# Patient Record
Sex: Female | Born: 1985 | Race: Black or African American | Hispanic: No | Marital: Single | Smoking: Former smoker
Health system: Southern US, Community
[De-identification: ages and names within clinical notes are randomized; demographics above are authoritative.]

## PROBLEM LIST (undated history)

## (undated) DIAGNOSIS — D573 Sickle-cell trait: Secondary | ICD-10-CM

## (undated) HISTORY — DX: Sickle-cell trait: D57.3

---

## 2005-03-10 ENCOUNTER — Emergency Department: Payer: Self-pay | Admitting: General Practice

## 2006-10-09 ENCOUNTER — Emergency Department: Payer: Self-pay | Admitting: Emergency Medicine

## 2006-10-11 ENCOUNTER — Ambulatory Visit: Payer: Self-pay | Admitting: Obstetrics and Gynecology

## 2006-10-15 ENCOUNTER — Ambulatory Visit: Payer: Self-pay | Admitting: Obstetrics and Gynecology

## 2006-11-12 ENCOUNTER — Ambulatory Visit: Payer: Self-pay | Admitting: Obstetrics and Gynecology

## 2006-12-03 ENCOUNTER — Ambulatory Visit: Payer: Self-pay | Admitting: Advanced Practice Midwife

## 2007-05-20 ENCOUNTER — Inpatient Hospital Stay: Payer: Self-pay

## 2008-04-02 IMAGING — US US OB < 14 WEEKS - US OB TV
1 series · 17 of 28 positions shown · non-contrast
Comparison: none

REASON FOR EXAM: Abd Pain    Pregnant
COMMENTS:

[Series 1: us ob < 14 weeks - us ob tv · 17 of 35 slices shown]
[im 1/35]
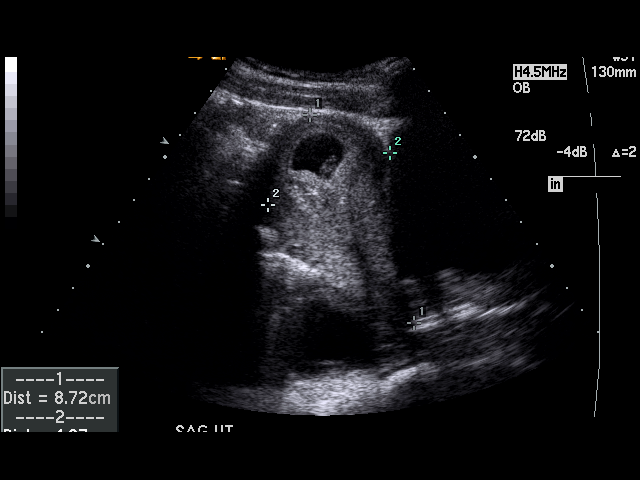
[im 3/35]
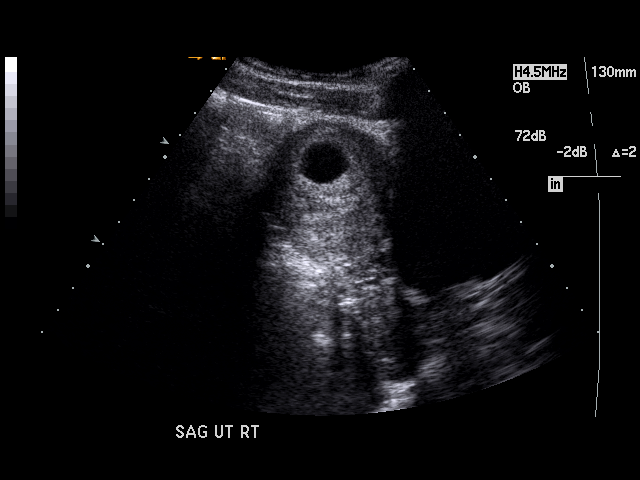
[im 6/35]
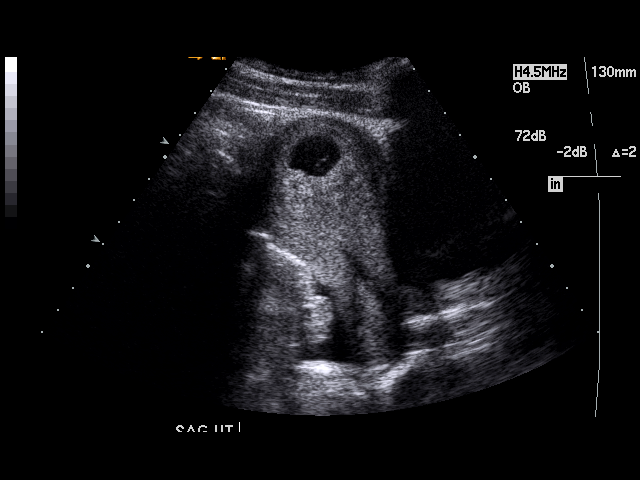
[im 7/35]
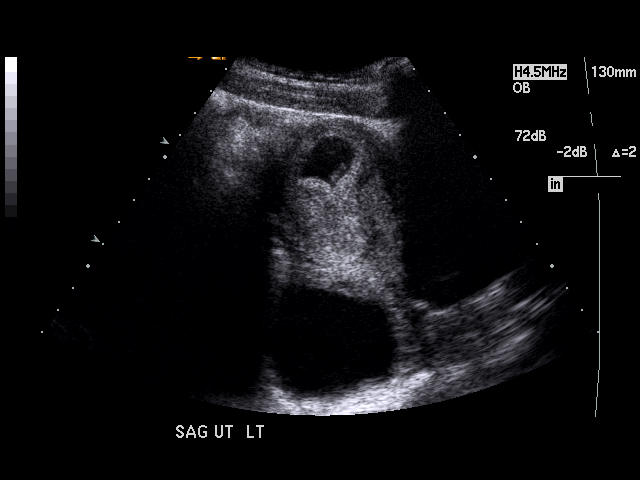
[im 9/35]
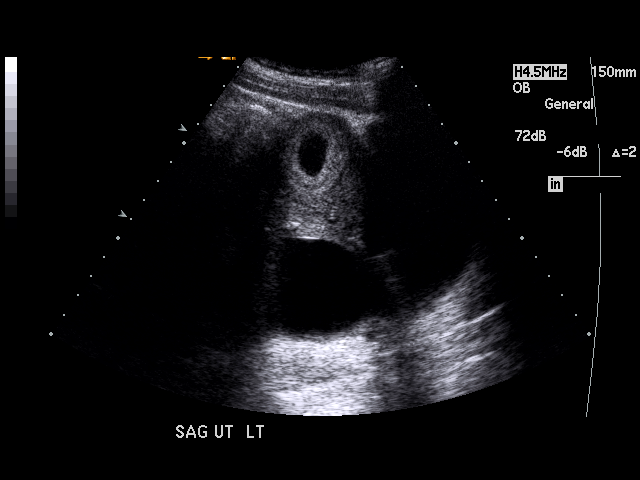
[im 12/35]
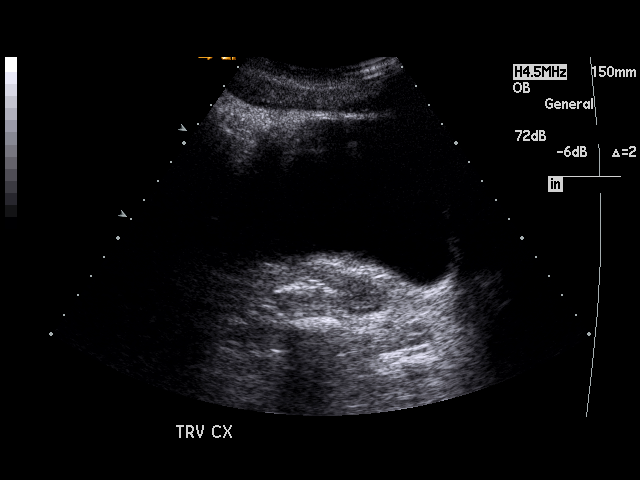
[im 13/35]
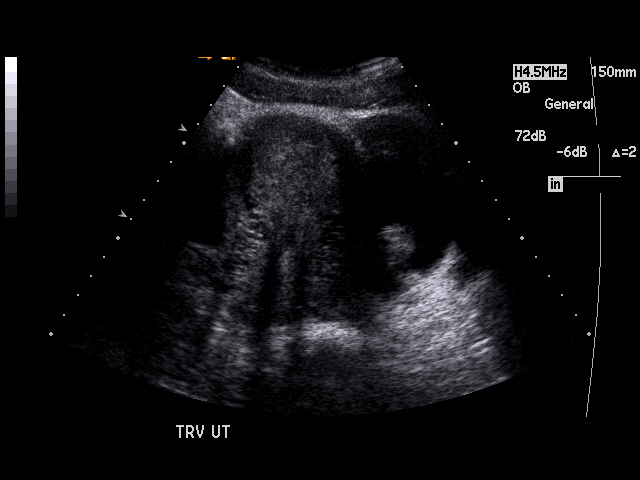
[im 16/35]
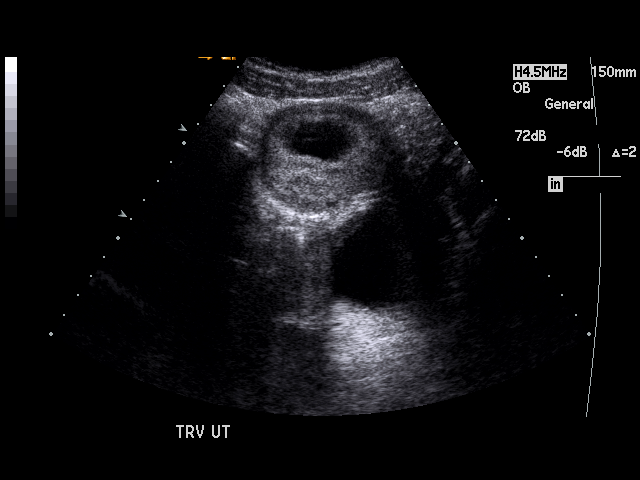
[im 18/35]
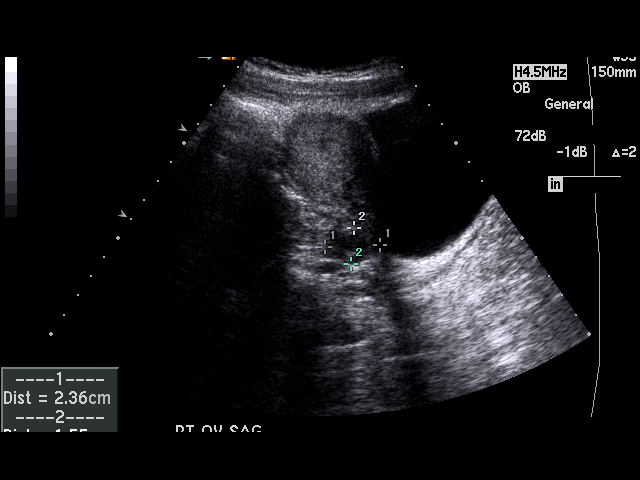
[im 19/35]
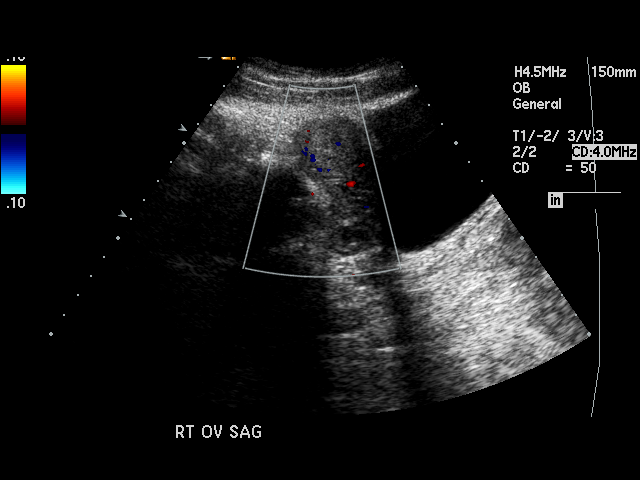
[im 22/35]
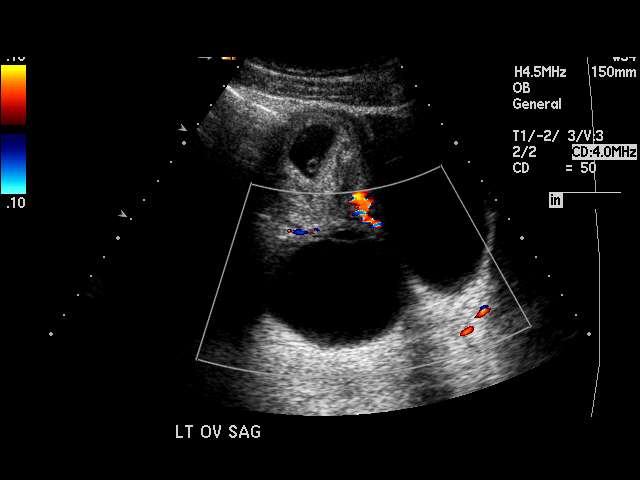
[im 23/35]
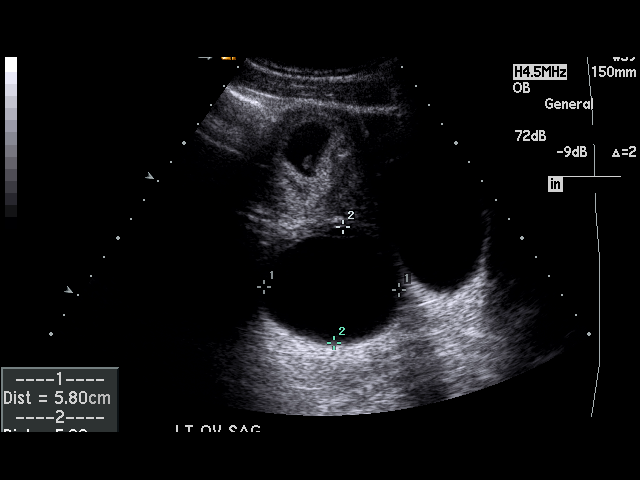
[im 26/35]
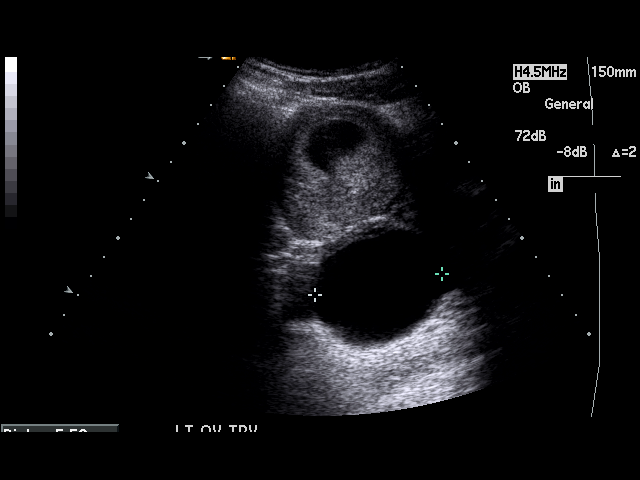
[im 28/35]
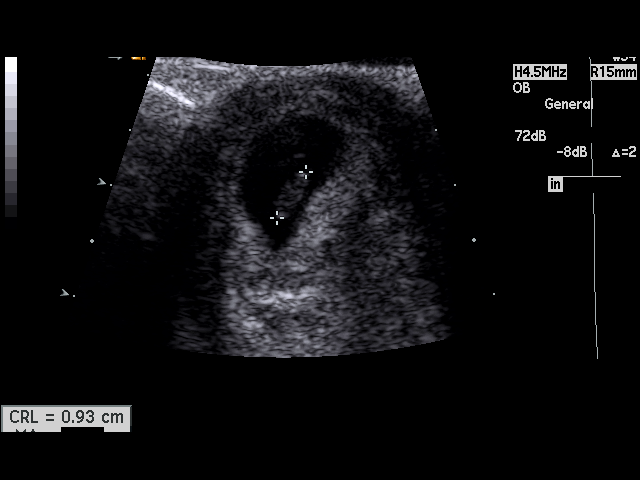
[im 29/35]
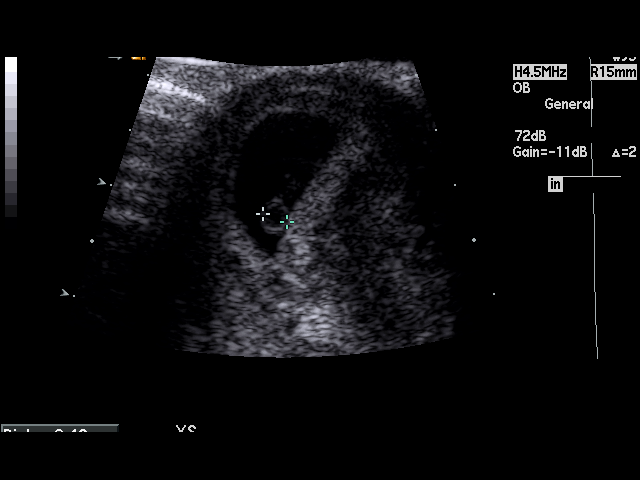
[im 32/35]
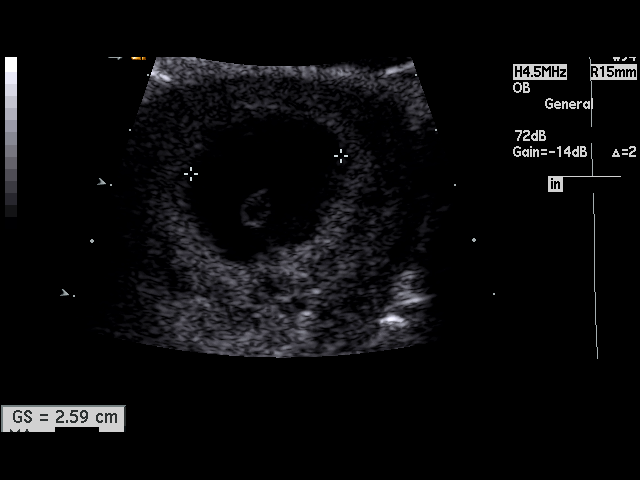
[im 35/35]
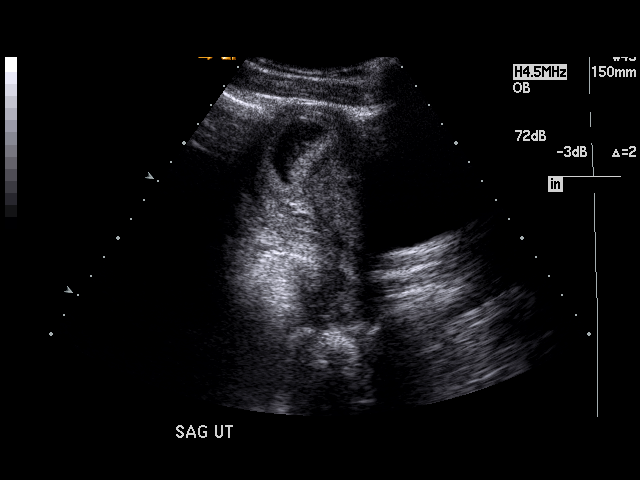

[17 of 28 positions shown; findings below may reference images not displayed]

PROCEDURE:     US  - US OB LESS THAN 14 WEEKS  - October 15, 2006  [DATE]

RESULT:     There is a living intrauterine embryo. Embryo heart rate was
monitored at 147 beats per minute. Crown-rump length measures 0.93 cm which
corresponds to an estimated gestational age 7 weeks 0 days. Gestational sac
size measures 2.59 cm which corresponds to an estimated gestational age of 7
weeks 2 days. The RIGHT ovary is normal appearance. There is a 5.8 cm cyst
of the LEFT every. No free fluid seen in the pelvis.
IMPRESSION: 1. Living intrauterine gestation of approximately 7 weeks 1 day  gestational
age.
2. Ultrasound EDD is June 02, 2007.
3. There is a 5.8 cm LEFT ovarian cyst.

## 2008-04-30 IMAGING — US US OB < 14 WEEKS - US OB TV
1 series · 17 of 28 positions shown · non-contrast
Comparison: none

REASON FOR EXAM: ovarian cyst
COMMENTS:

[Series 1: us ob < 14 weeks - us ob tv · 17 of 32 slices shown]
[im 1/32]
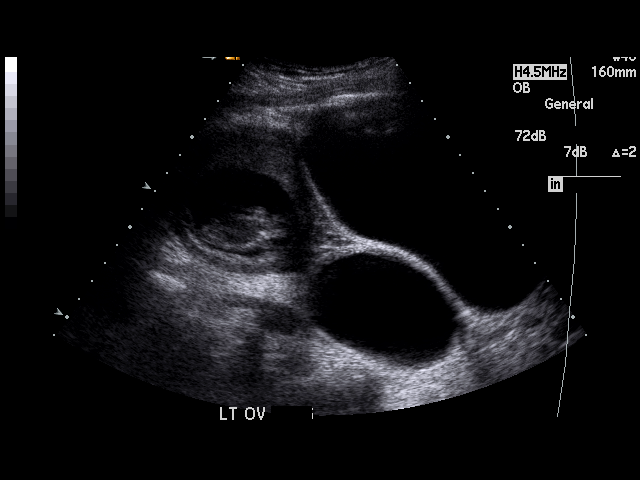
[im 3/32]
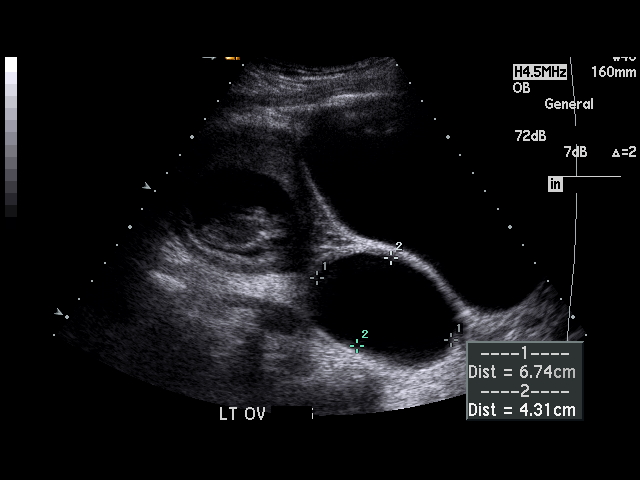
[im 5/32]
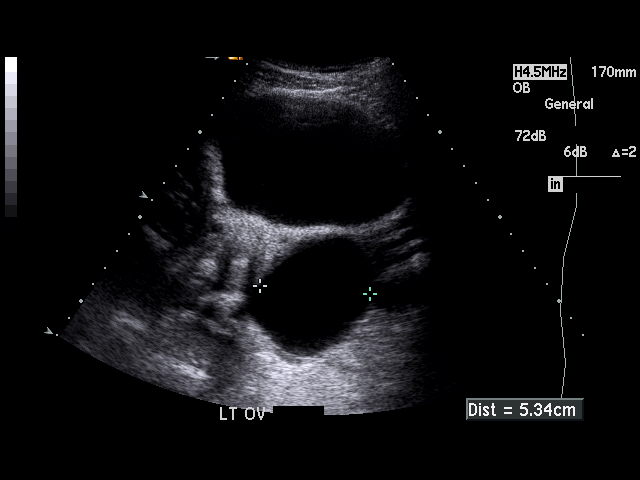
[im 6/32]
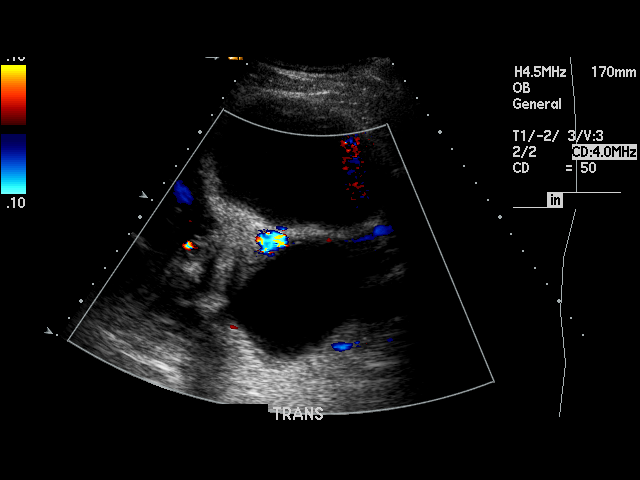
[im 9/32]
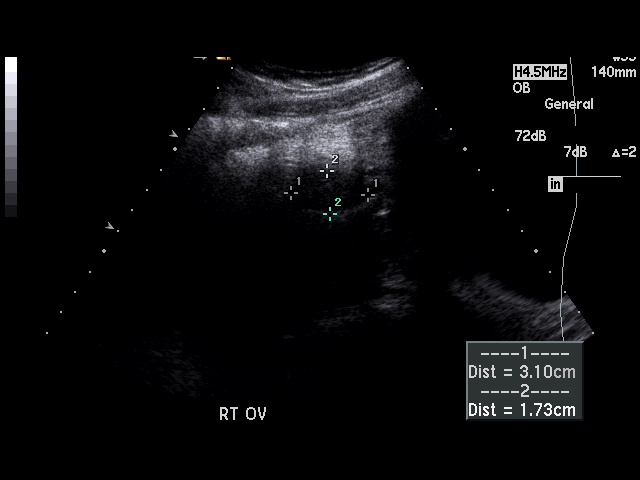
[im 11/32]
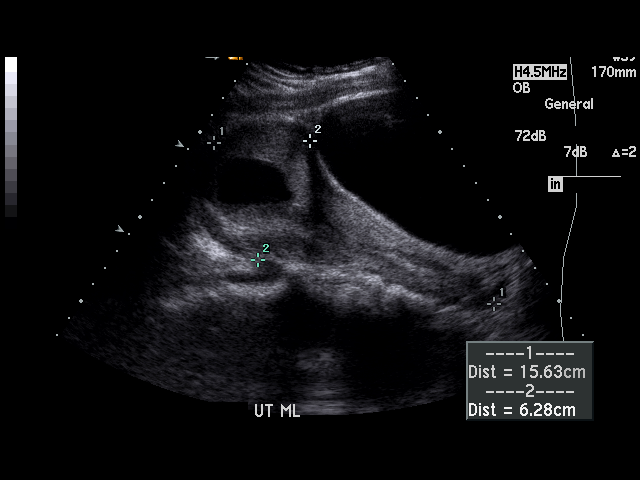
[im 12/32]
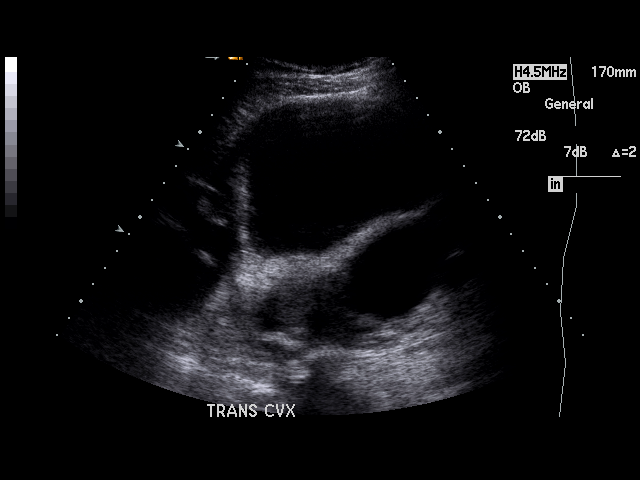
[im 14/32]
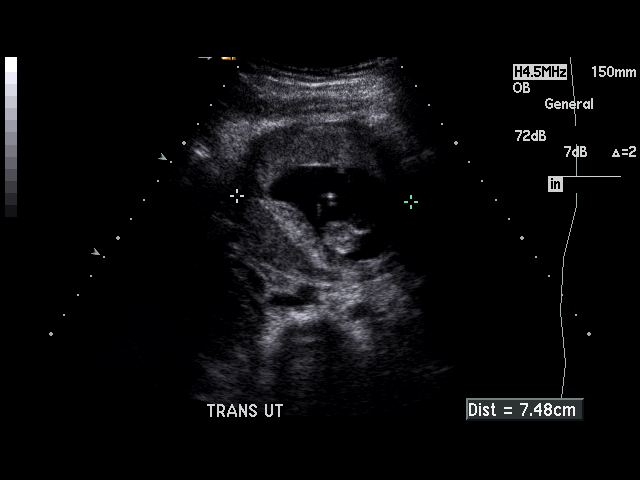
[im 17/32]
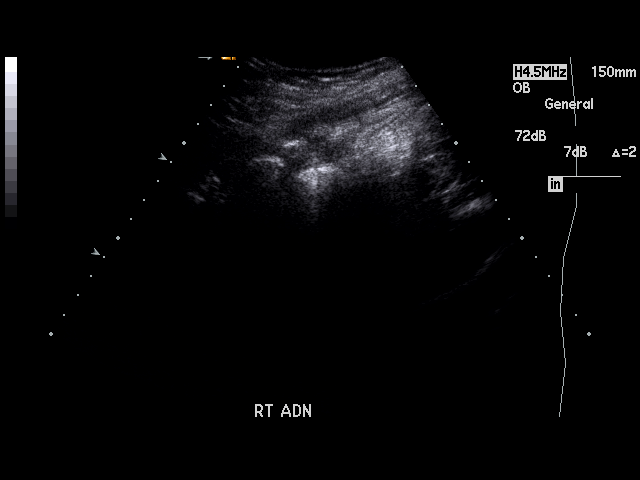
[im 18/32]
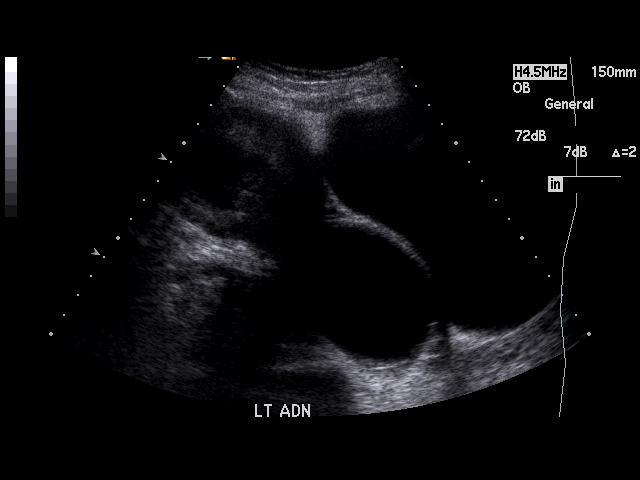
[im 20/32]
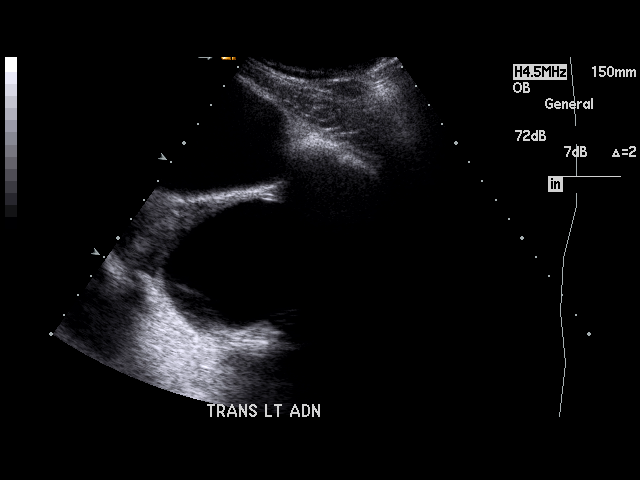
[im 21/32]
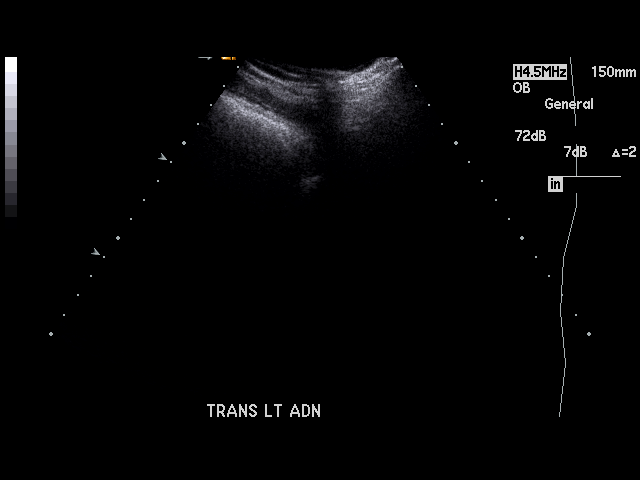
[im 23/32]
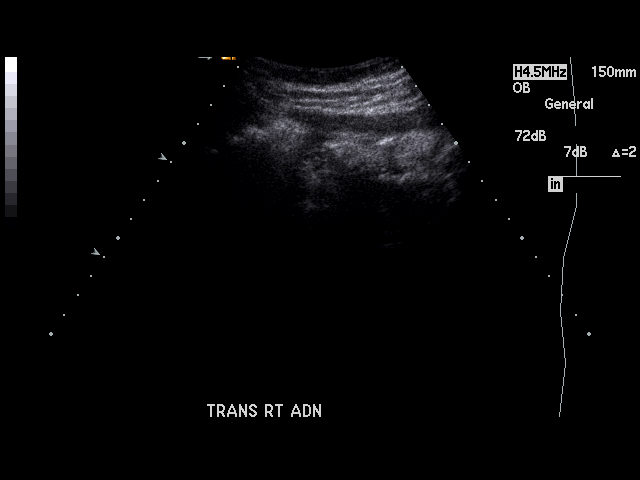
[im 26/32]
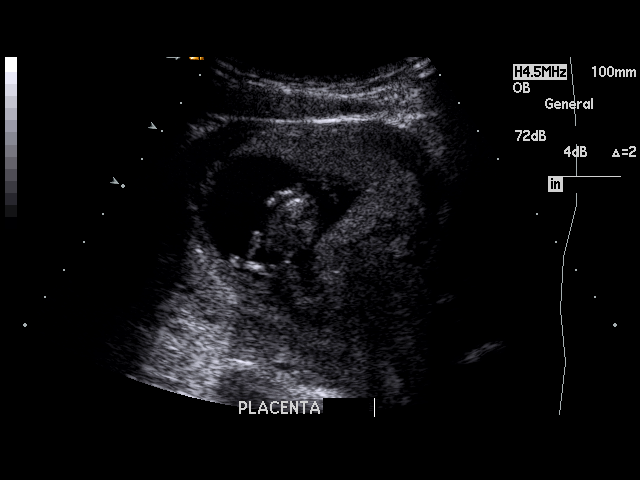
[im 27/32]
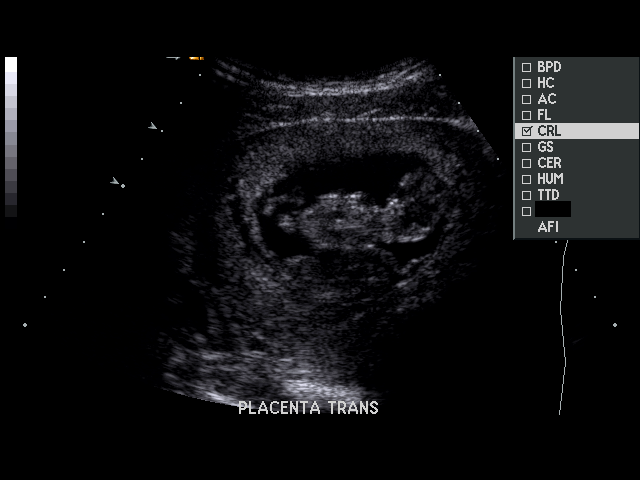
[im 29/32]
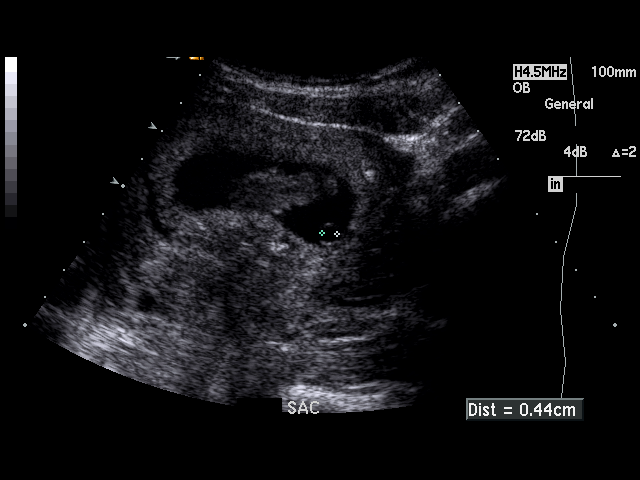
[im 32/32]
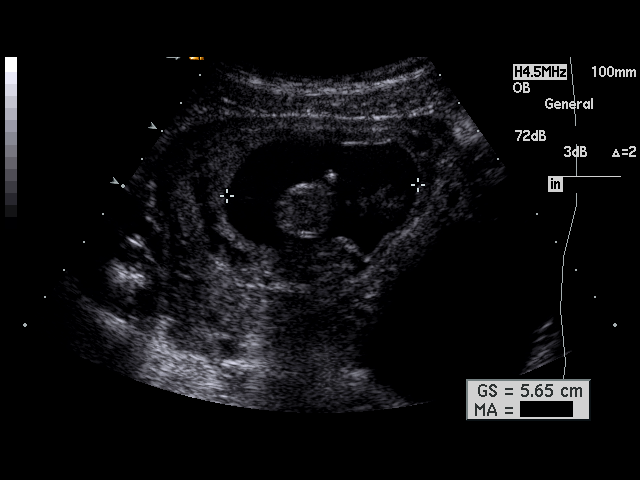

[17 of 28 positions shown; findings below may reference images not displayed]

PROCEDURE:     US  - US OB LESS THAN 14 WEEKS  - November 12, 2006  [DATE]

RESULT:      There is observed a living intrauterine gestation. Embryo heart
rate was monitored at 167 beats per minute. The crown-rump length measures
4.83 cm which corresponds to an estimated gestational age of 11 weeks 4
days. The gestational sac measures 5.65 cm which corresponds to 11 weeks 5
days. The yolk sac is visualized. Average ultrasound age is 11 weeks 4 days.
Ultrasound EDD is May 30, 2007.

Attention to the maternal pelvis again shows a LEFT ovarian cyst. On this
exam, the cyst measures 6.74 cm x 4.31 cm x 5.95 cm.
IMPRESSION: 1. Persistent LEFT ovarian cyst which on the current exam measures 6.74 cm
at maximum diameter.
2. No free fluid is seen in the pelvis.
3. Living intrauterine gestation of approximately 11 weeks 4 days
gestational age.

## 2008-05-21 IMAGING — US US OB < 14 WEEKS - US OB TV
1 series · 17 of 24 positions shown · non-contrast
Comparison: none

REASON FOR EXAM: re-eval left ovarian cyst
COMMENTS:

[Series 1: us ob < 14 weeks - us ob tv · 17 of 24 slices shown]
[im 1/24]
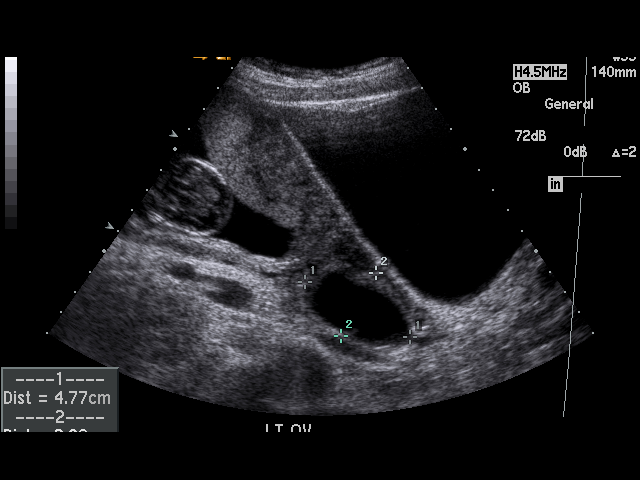
[im 3/24]
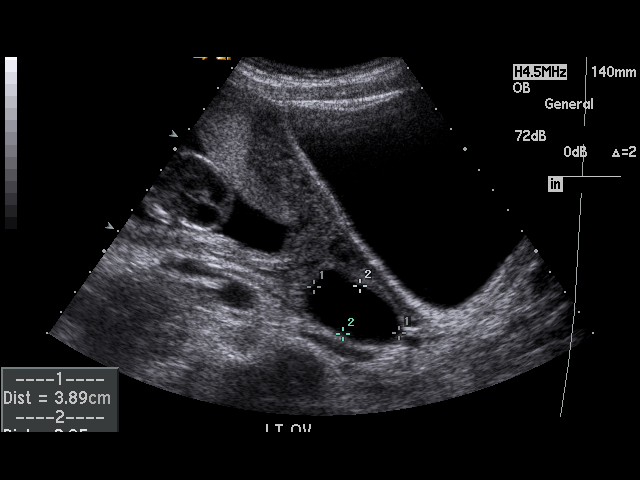
[im 4/24]
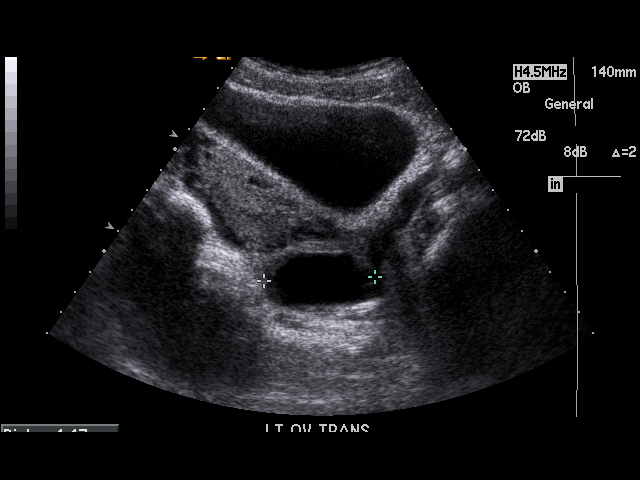
[im 5/24]
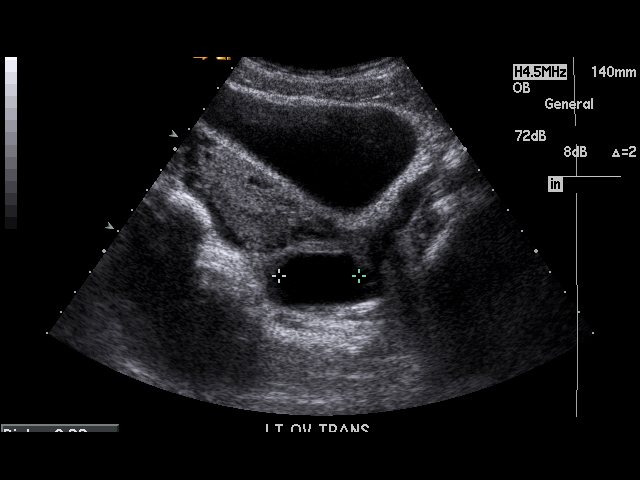
[im 7/24]
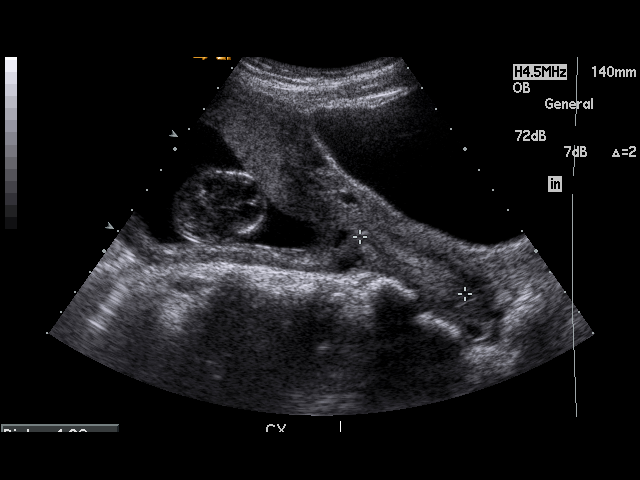
[im 8/24]
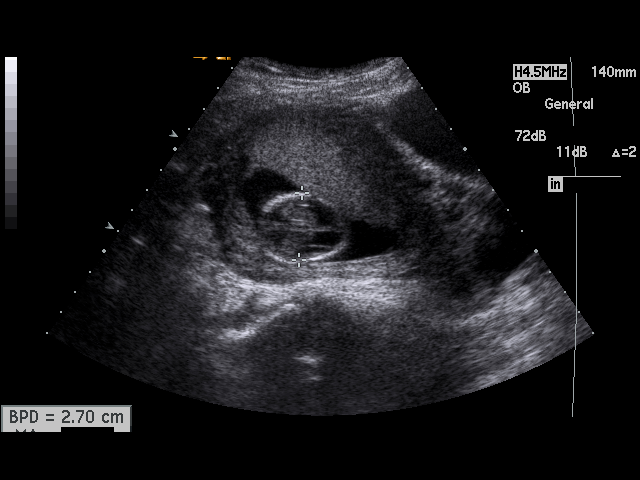
[im 10/24]
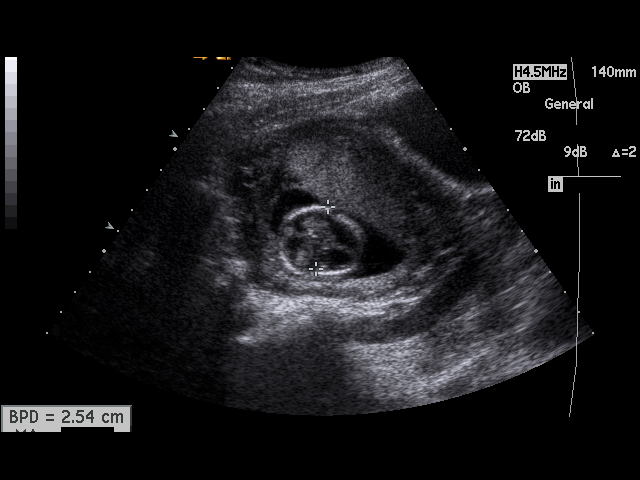
[im 11/24]
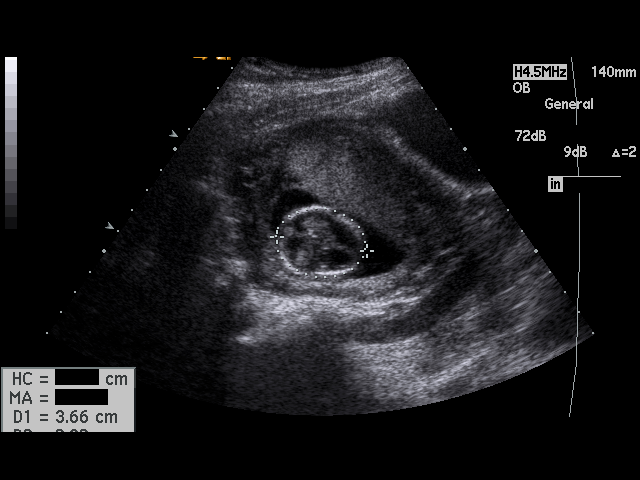
[im 13/24]
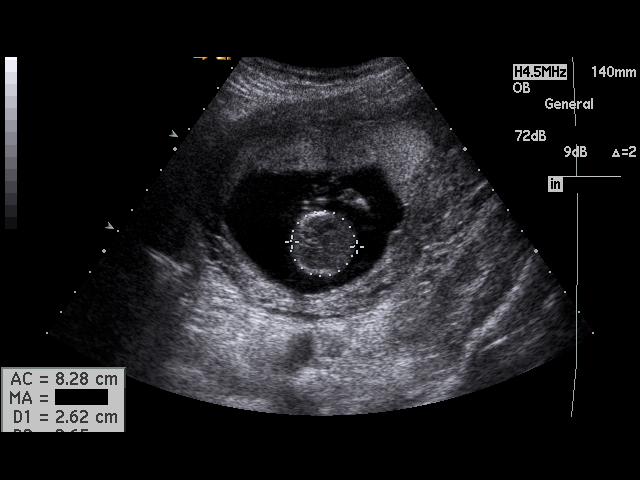
[im 14/24]
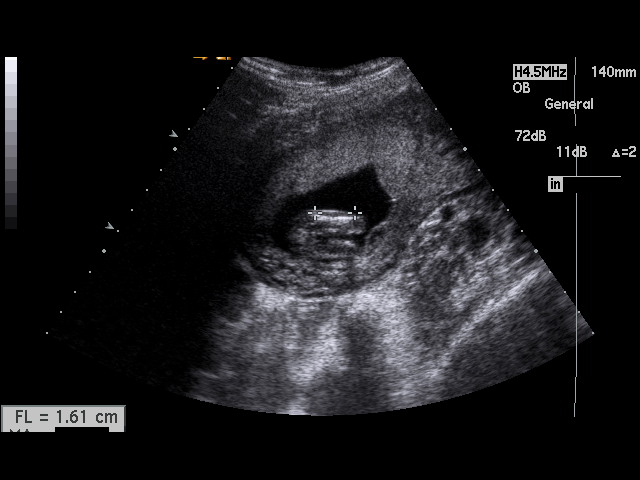
[im 15/24]
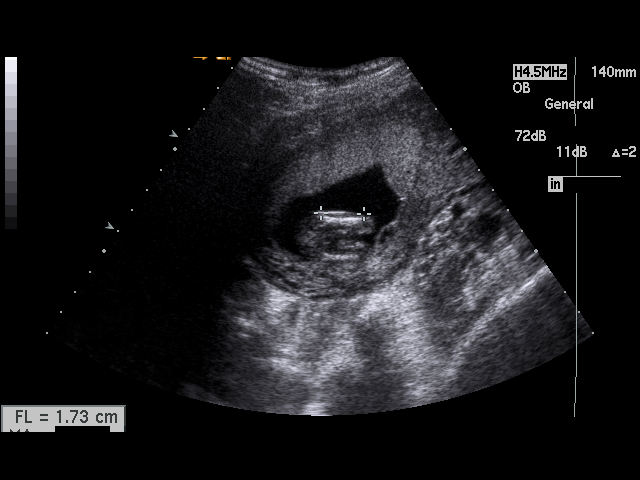
[im 17/24]
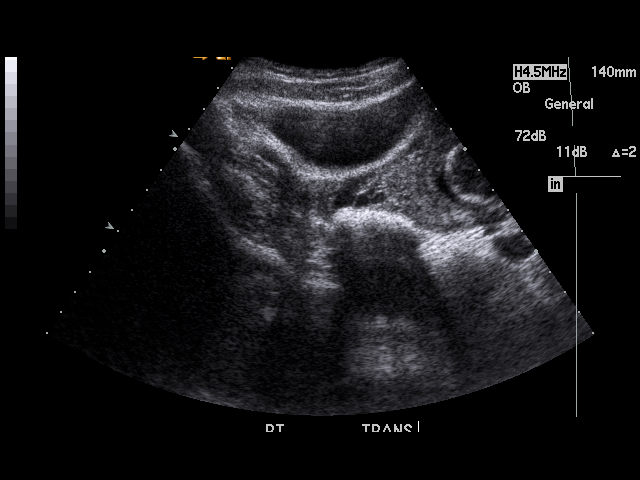
[im 18/24]
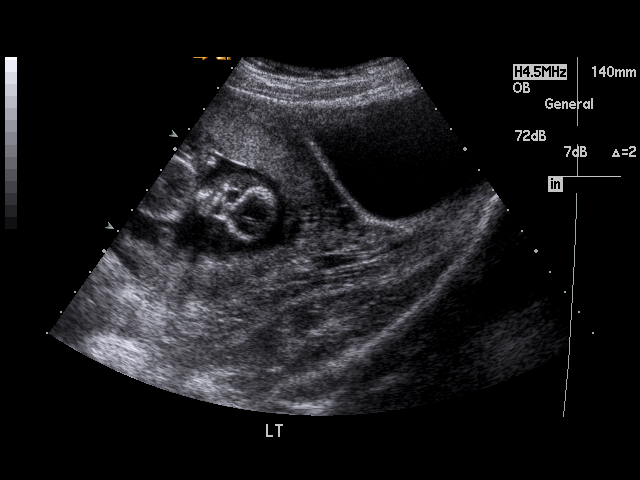
[im 20/24]
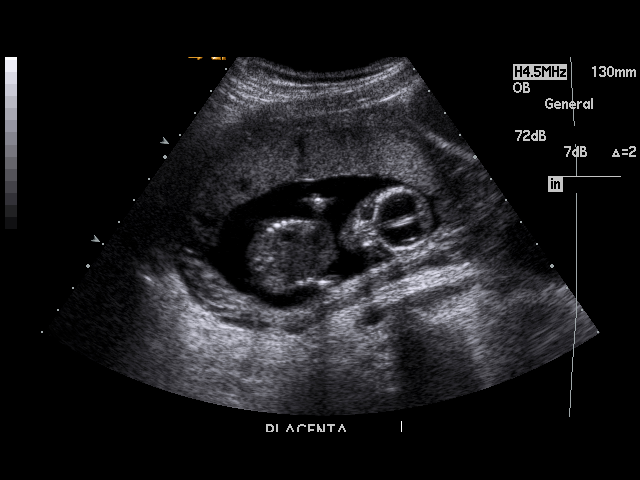
[im 21/24]
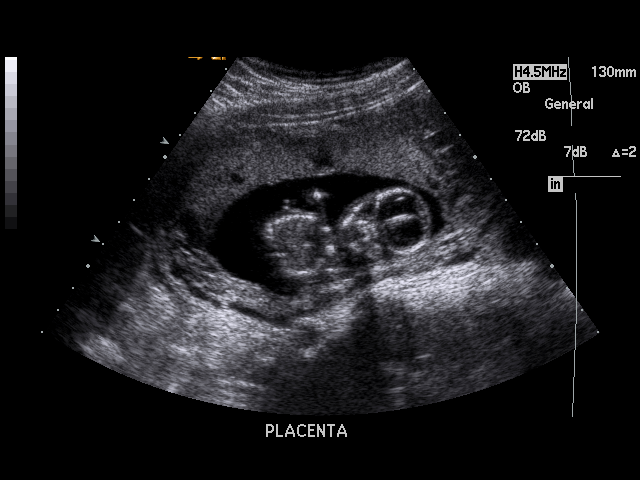
[im 22/24]
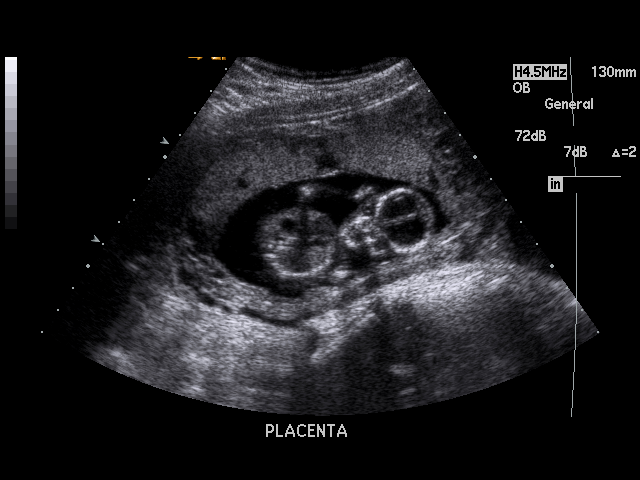
[im 24/24]
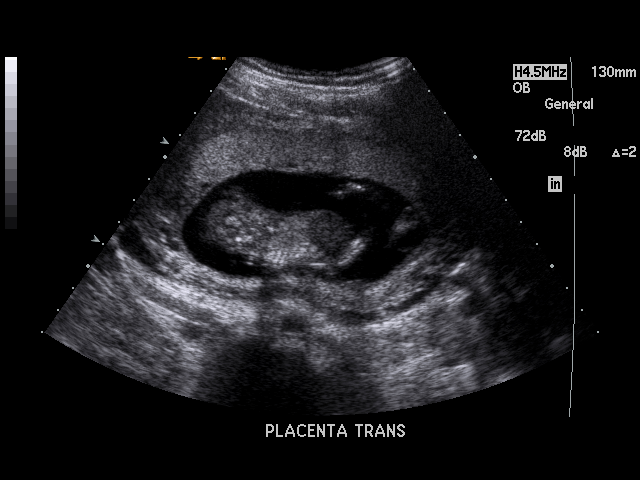

[17 of 24 positions shown; findings below may reference images not displayed]

PROCEDURE:     US  - US OB LESS THAN 14 WEEKS  - December 03, 2006 [DATE]

RESULT:     Sonographic evaluation is performed. The study demonstrates an
intrauterine gestation with a single fetal pole demonstrated. No gross
abnormalities are seen. The RIGHT ovary is not seen. The LEFT ovary measures
4.8 x 2.4 x 4.5 cm and contains a roughly 3.9 x 2.1 x 3.2 cm cyst. Fetal
heart rate is 150 beats per minute. Gestational age is calculated at 14
weeks 5 days. There appears to be appropriate interval growth. Ultrasound
estimated date of delivery is 05-29-07.
IMPRESSION: 1.Decreasing size of the LEFT ovarian cyst.

2.Appropriate interval growth.

## 2010-11-05 ENCOUNTER — Inpatient Hospital Stay: Payer: Self-pay | Admitting: Obstetrics and Gynecology

## 2010-11-06 HISTORY — PX: TUBAL LIGATION: SHX77

## 2010-12-19 ENCOUNTER — Ambulatory Visit: Payer: Self-pay | Admitting: Obstetrics and Gynecology

## 2010-12-20 ENCOUNTER — Ambulatory Visit: Payer: Self-pay | Admitting: Obstetrics and Gynecology

## 2014-11-01 ENCOUNTER — Emergency Department: Payer: Self-pay | Admitting: Emergency Medicine

## 2014-11-01 LAB — COMPREHENSIVE METABOLIC PANEL
ALBUMIN: 5.2 g/dL — AB
ANION GAP: 10 (ref 7–16)
Alkaline Phosphatase: 53 U/L
BILIRUBIN TOTAL: 1 mg/dL
BUN: 10 mg/dL
Calcium, Total: 9.9 mg/dL
Chloride: 103 mmol/L
Co2: 28 mmol/L
Creatinine: 0.78 mg/dL
EGFR (Non-African Amer.): >60
Glucose: 118 mg/dL — ABNORMAL HIGH
Potassium: 3.3 mmol/L — ABNORMAL LOW
SGOT(AST): 28 U/L
SGPT (ALT): 15 U/L
Sodium: 141 mmol/L
Total Protein: 8 g/dL

## 2014-11-01 LAB — SALICYLATE LEVEL: Salicylates, Serum: <4 mg/dL

## 2014-11-01 LAB — CBC
HCT: 40.4 % (ref 35.0–47.0)
HGB: 13.2 g/dL (ref 12.0–16.0)
MCH: 27.1 pg (ref 26.0–34.0)
MCHC: 32.7 g/dL (ref 32.0–36.0)
MCV: 83 fL (ref 80–100)
Platelet: 239 10*3/uL (ref 150–440)
RBC: 4.87 10*6/uL (ref 3.80–5.20)
RDW: 14.4 % (ref 11.5–14.5)
WBC: 5.9 10*3/uL (ref 3.6–11.0)

## 2014-11-01 LAB — ACETAMINOPHEN LEVEL: Acetaminophen: <10 ug/mL

## 2014-11-01 LAB — ETHANOL: Ethanol: <5 mg/dL

## 2021-03-15 ENCOUNTER — Other Ambulatory Visit: Payer: Self-pay

## 2021-03-15 ENCOUNTER — Ambulatory Visit: Payer: Self-pay | Admitting: Advanced Practice Midwife

## 2021-03-15 DIAGNOSIS — Z113 Encounter for screening for infections with a predominantly sexual mode of transmission: Secondary | ICD-10-CM

## 2021-03-15 DIAGNOSIS — A599 Trichomoniasis, unspecified: Secondary | ICD-10-CM

## 2021-03-15 DIAGNOSIS — A5901 Trichomonal vulvovaginitis: Secondary | ICD-10-CM

## 2021-03-15 DIAGNOSIS — F17290 Nicotine dependence, other tobacco product, uncomplicated: Secondary | ICD-10-CM

## 2021-03-15 DIAGNOSIS — T7411XA Adult physical abuse, confirmed, initial encounter: Secondary | ICD-10-CM | POA: Insufficient documentation

## 2021-03-15 DIAGNOSIS — T7421XA Adult sexual abuse, confirmed, initial encounter: Secondary | ICD-10-CM | POA: Insufficient documentation

## 2021-03-15 DIAGNOSIS — T7421XS Adult sexual abuse, confirmed, sequela: Secondary | ICD-10-CM

## 2021-03-15 DIAGNOSIS — T7411XS Adult physical abuse, confirmed, sequela: Secondary | ICD-10-CM

## 2021-03-15 LAB — WET PREP FOR TRICH, YEAST, CLUE
Trichomonas Exam: POSITIVE — AB
Yeast Exam: NEGATIVE

## 2021-03-15 MED ORDER — METRONIDAZOLE 500 MG PO TABS: 500.0000 mg | ORAL_TABLET | Freq: Two times a day (BID) | ORAL | 0 refills | Status: AC
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
## 2021-03-15 NOTE — Progress Notes (Signed)
Pt here for STD screening.  Wet mount results reviewed and medication dispensed per SO.  Pt given condoms.  Windle Guard, RN

## 2021-03-18 LAB — HM HEPATITIS C SCREENING LAB: HM Hepatitis Screen: NEGATIVE

## 2021-03-18 LAB — HM HIV SCREENING LAB: HM HIV Screening: NEGATIVE

## 2021-03-19 LAB — GONOCOCCUS CULTURE

## 2021-03-20 ENCOUNTER — Encounter (INDEPENDENT_AMBULATORY_CARE_PROVIDER_SITE_OTHER): Payer: Self-pay

## 2021-04-20 ENCOUNTER — Ambulatory Visit: Payer: Medicaid Other

## 2021-04-26 ENCOUNTER — Other Ambulatory Visit: Payer: Self-pay

## 2021-04-26 ENCOUNTER — Encounter: Payer: Self-pay | Admitting: Physician Assistant

## 2021-04-26 ENCOUNTER — Ambulatory Visit (LOCAL_COMMUNITY_HEALTH_CENTER): Payer: Medicaid Other | Admitting: Physician Assistant

## 2021-04-26 VITALS — BP 104/65 | Ht 65.0 in | Wt 131.6 lb

## 2021-04-26 DIAGNOSIS — Z124 Encounter for screening for malignant neoplasm of cervix: Secondary | ICD-10-CM

## 2021-04-26 DIAGNOSIS — Z01419 Encounter for gynecological examination (general) (routine) without abnormal findings: Secondary | ICD-10-CM

## 2021-04-26 DIAGNOSIS — Z30018 Encounter for initial prescription of other contraceptives: Secondary | ICD-10-CM

## 2021-04-26 DIAGNOSIS — Z3009 Encounter for other general counseling and advice on contraception: Secondary | ICD-10-CM

## 2021-04-26 LAB — WET PREP FOR TRICH, YEAST, CLUE
Trichomonas Exam: NEGATIVE
Yeast Exam: NEGATIVE

## 2021-04-26 NOTE — Progress Notes (Signed)
Pt here for PE and a pap smear.  Wet mount results reviewed, no treatment required. Windle Guard, RN

## 2021-04-27 ENCOUNTER — Encounter: Payer: Self-pay | Admitting: Physician Assistant
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
## 2021-04-29 LAB — IGP, APTIMA HPV
HPV Aptima: NEGATIVE
PAP Smear Comment: 0

## 2021-08-29 ENCOUNTER — Other Ambulatory Visit: Payer: Self-pay

## 2021-08-29 ENCOUNTER — Ambulatory Visit
Admission: RE | Admit: 2021-08-29 | Discharge: 2021-08-29 | Disposition: A | Discharge status: Home / Self Care | Payer: Medicaid - Out of State | Source: Ambulatory Visit | Attending: Emergency Medicine | Admitting: Emergency Medicine

## 2021-08-29 VITALS — BP 126/85 | HR 54 | Temp 97.9°F | Resp 18 | Ht 65.0 in | Wt 130.0 lb

## 2021-08-29 DIAGNOSIS — N760 Acute vaginitis: Secondary | ICD-10-CM | POA: Diagnosis present

## 2021-08-29 DIAGNOSIS — B9689 Other specified bacterial agents as the cause of diseases classified elsewhere: Secondary | ICD-10-CM | POA: Diagnosis present

## 2021-08-29 LAB — PREGNANCY, URINE: Preg Test, Ur: NEGATIVE

## 2021-08-29 LAB — URINALYSIS, COMPLETE (UACMP) WITH MICROSCOPIC
Bilirubin Urine: NEGATIVE
Glucose, UA: NEGATIVE mg/dL
Hgb urine dipstick: NEGATIVE
Ketones, ur: NEGATIVE mg/dL
Leukocytes,Ua: NEGATIVE
Nitrite: NEGATIVE
Protein, ur: NEGATIVE mg/dL
Specific Gravity, Urine: 1.025 (ref 1.005–1.030)
pH: 6 (ref 5.0–8.0)

## 2021-08-29 LAB — WET PREP, GENITAL
Sperm: NONE SEEN
Trich, Wet Prep: NONE SEEN
WBC, Wet Prep HPF POC: <10 — AB (ref <10)
Yeast Wet Prep HPF POC: NONE SEEN

## 2021-08-29 MED ORDER — METRONIDAZOLE 500 MG PO TABS: 500.0000 mg | ORAL_TABLET | Freq: Two times a day (BID) | ORAL | 0 refills | Status: DC
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
## 2021-08-29 NOTE — Discharge Instructions (Signed)

## 2021-08-29 NOTE — ED Triage Notes (Signed)
Pt here with C/O discharge started 1 week ago, different colors, smells like mildew, No known STD exposures. Has not had period since 07/08/2021. Tubes are tied

## 2021-08-30 ENCOUNTER — Other Ambulatory Visit: Payer: Self-pay

## 2021-08-30 ENCOUNTER — Ambulatory Visit
Admission: EM | Admit: 2021-08-30 | Discharge: 2021-08-30 | Disposition: A | Discharge status: Home / Self Care | Payer: Medicaid Other | Attending: Emergency Medicine | Admitting: Emergency Medicine

## 2021-08-30 ENCOUNTER — Telehealth (HOSPITAL_COMMUNITY): Payer: Self-pay | Admitting: Emergency Medicine

## 2021-08-30 DIAGNOSIS — A549 Gonococcal infection, unspecified: Secondary | ICD-10-CM

## 2021-08-30 LAB — CERVICOVAGINAL ANCILLARY ONLY
Chlamydia: NEGATIVE
Comment: NEGATIVE
Comment: NORMAL
Neisseria Gonorrhea: POSITIVE — AB

## 2021-08-30 MED ORDER — CEFTRIAXONE SODIUM 500 MG IJ SOLR
500.0000 mg | Freq: Once | INTRAMUSCULAR | Status: AC
  Administered 2021-08-30: 19:00:00 500 mg via INTRAMUSCULAR

## 2021-08-30 NOTE — Telephone Encounter (Signed)
Per protocol, patient will need treatment with IM Rocephin  500 mg for positive Gonorrhea Contacted patient by phone.  Verified identity using two identifiers.  Provided positive result.  Reviewed safe sex practices, notifying partners, and refraining from sexual activities for 7 days from time of treatment.  Patient verified understanding, all questions answered.   HHS notified 

## 2021-08-30 NOTE — ED Triage Notes (Signed)
Patient presents to Urgent care for STD treatment. Pt states Katlynne, RN called pt and reviewed results with her. Pt was educated on safe sex practices, treatment was discussed.  Voiced understanding.

## 2021-08-30 NOTE — ED Notes (Signed)
Rocephin injection was administered per order. Pt monitored for 15 mins. Tolerated well.

## 2021-11-04 LAB — BASIC METABOLIC PANEL
BUN: 8 (ref 4–21)
CO2: 22 (ref 13–22)
Chloride: 107 (ref 99–108)
Creatinine: 0.9 (ref 0.5–1.1)
Glucose: 91
Potassium: 4.2 mEq/L (ref 3.5–5.1)
Sodium: 141 (ref 137–147)

## 2021-11-04 LAB — COMPREHENSIVE METABOLIC PANEL
Albumin: 4.5 (ref 3.5–5.0)
Calcium: 9.2 (ref 8.7–10.7)
Globulin: 2.3
eGFR: 88

## 2021-11-04 LAB — HEPATIC FUNCTION PANEL
ALT: 11 U/L (ref 7–35)
AST: 19 (ref 13–35)
Alkaline Phosphatase: 40 (ref 25–125)
Bilirubin, Total: 0.6

## 2021-11-04 LAB — LIPID PANEL
Cholesterol: 128 (ref 0–200)
HDL: 48 (ref 35–70)
LDL Cholesterol: 66
Triglycerides: 70 (ref 40–160)

## 2021-11-04 LAB — CBC AND DIFFERENTIAL
HCT: 19 — AB (ref 36–46)
Hemoglobin: 5.7 — AB (ref 12.0–16.0)
Platelets: 304 10*3/uL (ref 150–400)
WBC: 3.5

## 2021-11-04 LAB — CBC: RBC: 2.6 — AB (ref 3.87–5.11)

## 2021-11-04 LAB — HEMOGLOBIN A1C: Hemoglobin A1C: 4.8

## 2021-11-07 ENCOUNTER — Ambulatory Visit: Payer: Self-pay

## 2021-11-07 NOTE — Telephone Encounter (Signed)
Pt called, she was advised by a nurse from her job that her lab work came back abnormal. Something was 5.7 and was supposed to be at 49. Pt was unsure which lab this was for. She states nothing has changed except she gets frequent headache. Pt was scheduled for NPA at [facility] with Dr. Zigmund Daniel.  ? ?Summary: Elevated blood sugar with headaches  ? Pt called reported elevated blood sugar and headaches, pt called to establish with [facility]. Declined appt offer  ?Best contact: [phone]   ?  ? ?Reason for Disposition ? Health Information question, no triage required and triager able to answer question ? ?Protocols used: Information Only Call - No Triage-A-AH ? ?

## 2021-12-07 ENCOUNTER — Telehealth: Payer: Self-pay | Admitting: Family Medicine

## 2021-12-07 ENCOUNTER — Ambulatory Visit: Payer: BC Managed Care – PPO | Admitting: Family Medicine

## 2021-12-07 ENCOUNTER — Encounter: Payer: Self-pay | Admitting: Family Medicine

## 2021-12-07 VITALS — BP 130/80 | HR 64 | Ht 65.0 in | Wt 129.0 lb

## 2021-12-07 DIAGNOSIS — K5909 Other constipation: Secondary | ICD-10-CM | POA: Diagnosis not present

## 2021-12-07 DIAGNOSIS — D573 Sickle-cell trait: Secondary | ICD-10-CM | POA: Diagnosis not present

## 2021-12-07 DIAGNOSIS — R5382 Chronic fatigue, unspecified: Secondary | ICD-10-CM

## 2021-12-07 DIAGNOSIS — D500 Iron deficiency anemia secondary to blood loss (chronic): Secondary | ICD-10-CM | POA: Diagnosis present

## 2021-12-07 DIAGNOSIS — D509 Iron deficiency anemia, unspecified: Secondary | ICD-10-CM

## 2021-12-07 MED ORDER — POLYETHYLENE GLYCOL 3350 17 G PO PACK: 17.0000 g | PACK | Freq: Every day | ORAL | 11 refills | Status: DC

## 2021-12-07 MED ORDER — FERROUS FUMARATE 150 MG PO TABS: 1.0000 | ORAL_TABLET | ORAL | 2 refills | Status: DC

## 2021-12-07 MED ORDER — SENNOSIDES-DOCUSATE SODIUM 8.6-50 MG PO TABS: 2.0000 | ORAL_TABLET | Freq: Two times a day (BID) | ORAL | 0 refills | Status: DC | PRN

## 2021-12-07 NOTE — Patient Instructions (Addendum)
-   Take iron every other day ?- Take Miralax daily, can adjust every 3 days, aim for dose that allows at least 1 smooth bowel movement daily ?- Can use Senna-S up to twice daily if no BM in 1 week ?- Follow-up with specialists ?- Return for physical in 2 months ?

## 2021-12-07 NOTE — Assessment & Plan Note (Signed)
Chronic condition noted in the setting of iron deficiency anemia, menorrhagia, sickle cell trait (stated).  Plan for thyroid function testing, referrals to gynecology and hematology placed. ?

## 2021-12-07 NOTE — Telephone Encounter (Signed)
Alexis Nelson from [facility] called to report that they do not have the Rx in stock. She is requesting a call back to discuss alternative options.  ? ? Ferrous Fumarate 150 MG TABS   ? ?Best contact: [phone] ?

## 2021-12-07 NOTE — Assessment & Plan Note (Signed)
Acute on chronic condition recently noted on her work-related labs, she does give a history of sickle cell trait though we have no confirmatory testing in this regard.  Additionally, she gives a history of longstanding menorrhagia, has had tubal ligation and cites this as the reason behind it no need for oral concept of pills.  Has trialed OTC iron daily roughly 10+ years prior but discontinued this due to GI related symptoms. ? ?At this stage, given her stated history and risk factors I have placed referrals to gynecology for further evaluation of her menorrhagia, hematology for both the noted iron deficiency anemia and stated history of sickle cell trait, additionally restratification labs ordered, and she will be started on every other day iron supplementation. ?

## 2021-12-07 NOTE — Assessment & Plan Note (Addendum)
Chronic issue that patient has been dealing with for years, she states that she has bowel movements very infrequently citing several days, at times over 1 week, denies any overt abdominal pain, no nausea, no emesis, describes a high appetite and no postprandial symptoms.  Her abdominal examination reveals soft, nontender, nondistended abdomen, normal active bowel sounds noted, and no hepatosplenomegaly appreciated.  Plan for thyroid function testing, initiation of bowel regimen with daily MiraLAX and as needed senna-docusate, and dietary guidance provided. ? ?Chronic condition, symptomatic, orders placed, Rx management ?

## 2021-12-07 NOTE — Telephone Encounter (Signed)
Please advise of another option

## 2021-12-07 NOTE — Progress Notes (Signed)
?  ? ?Primary Care / Sports Medicine Office Visit ? ?Patient Information:  ?Patient ID: Alexis Nelson, female DOB: 07-09-1986 Age: 36 y.o. MRN: [id number]  ? ?Alexis Nelson is a pleasant 36 y.o. female presenting with the following: ? ?Chief Complaint  ?Patient presents with  ? Establish Care  ? Menstrual Problem  ?  Bled for 1.5 months with clots. Tubs are tied.   ? Low iron  ?  Pt use to take Iron supplements but made her constipated.   ? ? ?Vitals:  ? 12/07/21 0914  ?BP: 130/80  ?Pulse: 64  ?SpO2: 99%  ? ?Vitals:  ? 12/07/21 0914  ?Weight: 129 lb (58.5 kg)  ?Height: '5\' 5"'$  (1.651 m)  ? ?Body mass index is 21.47 kg/m?. ? ?No results found.  ? ?Independent interpretation of notes and tests performed by another provider:  ? ?None ? ?Procedures performed:  ? ?None ? ?Pertinent History, Exam, Impression, and Recommendations:  ? ?Problem List Items Addressed This Visit   ? ?  ? Digestive  ? Constipation, chronic  ?  Chronic issue that patient has been dealing with for years, she states that she has bowel movements very infrequently citing several days, at times over 1 week, denies any overt abdominal pain, no nausea, no emesis, describes a high appetite and no postprandial symptoms.  Her abdominal examination reveals soft, nontender, nondistended abdomen, normal active bowel sounds noted, and no hepatosplenomegaly appreciated.  Plan for thyroid function testing, initiation of bowel regimen with daily MiraLAX and as needed senna-docusate, and dietary guidance provided. ? ?Chronic condition, symptomatic, orders placed, Rx management ? ?  ?  ? Relevant Medications  ? senna-docusate (SENOKOT-S) 8.6-50 MG tablet  ? polyethylene glycol (MIRALAX / GLYCOLAX) 17 g packet  ?  ? Other  ? Iron deficiency anemia - Primary  ?  Acute on chronic condition recently noted on her work-related labs, she does give a history of sickle cell trait though we have no confirmatory testing in this regard.  Additionally, she gives a  history of longstanding menorrhagia, has had tubal ligation and cites this as the reason behind it no need for oral concept of pills.  Has trialed OTC iron daily roughly 10+ years prior but discontinued this due to GI related symptoms. ? ?At this stage, given her stated history and risk factors I have placed referrals to gynecology for further evaluation of her menorrhagia, hematology for both the noted iron deficiency anemia and stated history of sickle cell trait, additionally restratification labs ordered, and she will be started on every other day iron supplementation. ? ?  ?  ? Relevant Medications  ? Ferrous Fumarate 150 MG TABS  ? Other Relevant Orders  ? Ambulatory referral to Gynecology  ? Ambulatory referral to Hematology / Oncology  ? Pathologist smear review  ? Sickle cell trait ([place])  ? Relevant Orders  ? Ambulatory referral to Hematology / Oncology  ? Pathologist smear review  ? Chronic fatigue  ?  Chronic condition noted in the setting of iron deficiency anemia, menorrhagia, sickle cell trait (stated).  Plan for thyroid function testing, referrals to gynecology and hematology placed. ? ?  ?  ? Relevant Orders  ? T3, free  ? T4, free  ? TSH  ? Pathologist smear review  ?  ? ?Orders & Medications ?Meds ordered this encounter  ?Medications  ? senna-docusate (SENOKOT-S) 8.6-50 MG tablet  ?  Sig: Take 2 tablets by mouth 2 (two) times daily as needed  for mild constipation or moderate constipation. Until stooling regularly  ?  Dispense:  60 tablet  ?  Refill:  0  ? Ferrous Fumarate 150 MG TABS  ?  Sig: Take 1 tablet (150 mg total) by mouth every other day.  ?  Dispense:  15 tablet  ?  Refill:  2  ? polyethylene glycol (MIRALAX / GLYCOLAX) 17 g packet  ?  Sig: Take 17 g by mouth daily. Until stooling regularly, adjust dose every 3 days to allow at least 1 smooth BM daily.  ?  Dispense:  30 packet  ?  Refill:  11  ? ?Orders Placed This Encounter  ?Procedures  ? T3, free  ? T4, free  ? TSH  ? Pathologist smear  review  ? Basic metabolic panel  ? Hemoglobin A1c  ? CBC and differential  ? CBC  ? Basic metabolic panel  ? Comprehensive metabolic panel  ? Lipid panel  ? Hepatic function panel  ? Ambulatory referral to Gynecology  ? Ambulatory referral to Hematology / Oncology  ?  ? ?Return in about 2 months (around 02/06/2022) for annual physical.  ?  ? ?Montel Culver, MD ? ? Primary Care Sports Medicine ?[facility] ?[place]  ? ?

## 2021-12-07 NOTE — Assessment & Plan Note (Signed)
>>  ASSESSMENT AND PLAN FOR CHRONIC FATIGUE WRITTEN ON 12/07/2021 10:54 AM BY Aribella Vavra J, MD  Chronic condition noted in the setting of iron  deficiency anemia, menorrhagia, sickle cell trait (stated).  Plan for thyroid  function testing, referrals to gynecology and hematology placed.

## 2021-12-08 ENCOUNTER — Other Ambulatory Visit: Payer: Self-pay

## 2021-12-08 LAB — T3, FREE: T3, Free: 3.1 pg/mL (ref 2.0–4.4)

## 2021-12-08 LAB — T4, FREE: Free T4: 1.28 ng/dL (ref 0.82–1.77)

## 2021-12-08 LAB — TSH: TSH: 1.02 u[IU]/mL (ref 0.450–4.500)

## 2021-12-08 MED ORDER — IRON (FERROUS SULFATE) 325 (65 FE) MG PO TABS: 325.0000 mg | ORAL_TABLET | ORAL | 2 refills | Status: DC

## 2021-12-08 NOTE — Telephone Encounter (Signed)
Ferrous Sulfate 325 Mg is needed. Spoke to [place] she verbalized understanding. ? ?KP ?

## 2021-12-12 ENCOUNTER — Other Ambulatory Visit: Payer: Self-pay | Admitting: *Deleted

## 2021-12-12 ENCOUNTER — Inpatient Hospital Stay: Payer: BC Managed Care – PPO | Attending: Hematology & Oncology

## 2021-12-12 ENCOUNTER — Telehealth: Payer: Self-pay | Admitting: *Deleted

## 2021-12-12 ENCOUNTER — Other Ambulatory Visit: Payer: Self-pay | Admitting: Family

## 2021-12-12 ENCOUNTER — Encounter: Payer: Self-pay | Admitting: Family

## 2021-12-12 ENCOUNTER — Inpatient Hospital Stay: Payer: BC Managed Care – PPO

## 2021-12-12 ENCOUNTER — Inpatient Hospital Stay (HOSPITAL_BASED_OUTPATIENT_CLINIC_OR_DEPARTMENT_OTHER): Payer: BC Managed Care – PPO | Admitting: Family

## 2021-12-12 VITALS — BP 116/71 | HR 78 | Temp 98.4°F | Resp 18 | Ht 65.0 in | Wt 130.4 lb

## 2021-12-12 VITALS — BP 116/75 | HR 80

## 2021-12-12 DIAGNOSIS — N921 Excessive and frequent menstruation with irregular cycle: Secondary | ICD-10-CM | POA: Insufficient documentation

## 2021-12-12 DIAGNOSIS — D509 Iron deficiency anemia, unspecified: Secondary | ICD-10-CM | POA: Insufficient documentation

## 2021-12-12 DIAGNOSIS — D573 Sickle-cell trait: Secondary | ICD-10-CM

## 2021-12-12 DIAGNOSIS — D649 Anemia, unspecified: Secondary | ICD-10-CM

## 2021-12-12 DIAGNOSIS — D500 Iron deficiency anemia secondary to blood loss (chronic): Secondary | ICD-10-CM

## 2021-12-12 LAB — CBC WITH DIFFERENTIAL (CANCER CENTER ONLY)
Abs Immature Granulocytes: 0 10*3/uL (ref 0.00–0.07)
Basophils Absolute: 0.1 10*3/uL (ref 0.0–0.1)
Basophils Relative: 2 %
Eosinophils Absolute: 0.2 10*3/uL (ref 0.0–0.5)
Eosinophils Relative: 6 %
HCT: 22 % — ABNORMAL LOW (ref 36.0–46.0)
Hemoglobin: 6.3 g/dL — CL (ref 12.0–15.0)
Immature Granulocytes: 0 %
Lymphocytes Relative: 37 %
Lymphs Abs: 1.4 10*3/uL (ref 0.7–4.0)
MCH: 18.8 pg — ABNORMAL LOW (ref 26.0–34.0)
MCHC: 28.6 g/dL — ABNORMAL LOW (ref 30.0–36.0)
MCV: 65.5 fL — ABNORMAL LOW (ref 80.0–100.0)
Monocytes Absolute: 0.3 10*3/uL (ref 0.1–1.0)
Monocytes Relative: 7 %
Neutro Abs: 1.9 10*3/uL (ref 1.7–7.7)
Neutrophils Relative %: 48 %
Platelet Count: 360 10*3/uL (ref 150–400)
RBC: 3.36 MIL/uL — ABNORMAL LOW (ref 3.87–5.11)
RDW: 20.3 % — ABNORMAL HIGH (ref 11.5–15.5)
WBC Count: 3.8 10*3/uL — ABNORMAL LOW (ref 4.0–10.5)
nRBC: 0 % (ref 0.0–0.2)

## 2021-12-12 LAB — CMP (CANCER CENTER ONLY)
ALT: 8 U/L (ref 0–44)
AST: 13 U/L — ABNORMAL LOW (ref 15–41)
Albumin: 4.3 g/dL (ref 3.5–5.0)
Alkaline Phosphatase: 30 U/L — ABNORMAL LOW (ref 38–126)
Anion gap: 6 (ref 5–15)
BUN: 7 mg/dL (ref 6–20)
CO2: 28 mmol/L (ref 22–32)
Calcium: 9.5 mg/dL (ref 8.9–10.3)
Chloride: 107 mmol/L (ref 98–111)
Creatinine: 0.78 mg/dL (ref 0.44–1.00)
GFR, Estimated: >60 mL/min (ref >60)
Glucose, Bld: 117 mg/dL — ABNORMAL HIGH (ref 70–99)
Potassium: 3.6 mmol/L (ref 3.5–5.1)
Sodium: 141 mmol/L (ref 135–145)
Total Bilirubin: 0.5 mg/dL (ref 0.3–1.2)
Total Protein: 6.5 g/dL (ref 6.5–8.1)

## 2021-12-12 LAB — IRON AND IRON BINDING CAPACITY (CC-WL,HP ONLY)
Iron: 11 ug/dL — ABNORMAL LOW (ref 28–170)
Saturation Ratios: 2 % — ABNORMAL LOW (ref 10.4–31.8)
TIBC: 501 ug/dL — ABNORMAL HIGH (ref 250–450)
UIBC: 490 ug/dL — ABNORMAL HIGH (ref 148–442)

## 2021-12-12 LAB — SAMPLE TO BLOOD BANK

## 2021-12-12 LAB — RETICULOCYTES
Immature Retic Fract: 25.8 % — ABNORMAL HIGH (ref 2.3–15.9)
RBC.: 3.34 MIL/uL — ABNORMAL LOW (ref 3.87–5.11)
Retic Count, Absolute: 16.7 10*3/uL — ABNORMAL LOW (ref 19.0–186.0)
Retic Ct Pct: 0.5 % (ref 0.4–3.1)

## 2021-12-12 LAB — FERRITIN: Ferritin: 2 ng/mL — ABNORMAL LOW (ref 11–307)

## 2021-12-12 LAB — LACTATE DEHYDROGENASE: LDH: 118 U/L (ref 98–192)

## 2021-12-12 LAB — SAVE SMEAR(SSMR), FOR PROVIDER SLIDE REVIEW

## 2021-12-12 MED ORDER — SODIUM CHLORIDE 0.9% FLUSH: 10.0000 mL | Freq: Once | INTRAVENOUS | Status: DC | PRN

## 2021-12-12 MED ORDER — SODIUM CHLORIDE 0.9 % IV SOLN: Freq: Once | INTRAVENOUS | Status: AC

## 2021-12-12 MED ORDER — FOLIC ACID 1 MG PO TABS: 1.0000 mg | ORAL_TABLET | Freq: Every day | ORAL | 11 refills | Status: DC

## 2021-12-12 MED ORDER — SODIUM CHLORIDE 0.9 % IV SOLN
125.0000 mg | Freq: Once | INTRAVENOUS | Status: AC
  Administered 2021-12-12: 125 mg via INTRAVENOUS
  Filled 2021-12-12: qty 10

## 2021-12-12 MED ORDER — SODIUM CHLORIDE 0.9% FLUSH: 3.0000 mL | Freq: Once | INTRAVENOUS | Status: DC | PRN

## 2021-12-12 NOTE — Progress Notes (Signed)
Hematology/Oncology Consultation  ? ?Name: Alexis Nelson      MRN: [id number]    Location: Room/bed info not found  Date: 12/12/2021 Time:9:20 AM ? ? ?REFERRING PHYSICIAN: Rosette Reveal, MD ? ?REASON FOR CONSULT: Iron deficiency anemia  ?  ?DIAGNOSIS: Iron deficiency anemia and sickle cell trait ? ?HISTORY OF PRESENT ILLNESS: Alexis Nelson is a very pleasant 36 yo African American female with history of anemia since her first pregnancy as well as the sickle cell trait.  ?She states that in the last few months she has had a heavy cycle with lots of clots.  ?No other blood loss noted. No abnormal bruising, no petechiae.  ?She states that her PCP has referred her to gynecology as well and she is waiting for them to call and schedule her.  ?She denies fatigue or weakness. She states that she will occasionally has palpitations when going up and down stairs.  ?She has 2 beautiful, healthy children and was able to carry them both to term and deliver naturally.  ?Her only surgery to date is tubal ligation around 11 years ago.  ?No personal history of cancer. Her father has renal cell and her paternal aunt has history of breast cancer.  ?No history of diabetes or thyroid disease.  ?No fever, chills, n/v, cough, rash, dizziness, SOB, chest pain, abdominal pain or changes in bowel or bladder habits.  ?She has chronic constipation and was unable to tolerate oral iron supplements.  ?No swelling, tenderness, numbness or tingling in her extremities.  ?No falls or syncope.  ?She has maintained a good appetite and is staying well hydrated. Her weight is stable.  ?No smoking, ETOH or recreational drug use.  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  ? ?ROS: All other 10 point review of systems is negative.  ? ?PAST MEDICAL HISTORY:   ?Past Medical History:  ?Diagnosis Date  ? Sickle cell trait ([place])   ? ? ?ALLERGIES: ?No Known Allergies ?    ?MEDICATIONS:  ?Current Outpatient Medications on File Prior to Visit  ?Medication Sig Dispense Refill  ? polyethylene glycol (MIRALAX / GLYCOLAX) 17 g packet Take 17 g by mouth daily. Until stooling regularly, adjust dose every 3 days to allow at least 1 smooth BM daily. 30 packet 11  ? senna-docusate (SENOKOT-S) 8.6-50 MG tablet Take 2 tablets by mouth 2 (two) times daily as needed for mild constipation or moderate constipation. Until stooling regularly 60 tablet 0  ? Iron, Ferrous Sulfate, 325 (65 Fe) MG TABS Take 325 mg by mouth every other day. (Patient not taking: Reported on 12/12/2021) 15 tablet 2  ? ?No current facility-administered medications on file prior to visit.  ? ?  ?PAST SURGICAL HISTORY ?Past Surgical History:  ?Procedure Laterality Date  ? TUBAL LIGATION  11/2010  ? ? ?FAMILY HISTORY: ?Family History  ?Problem Relation Age of Onset  ? Cancer Mother   ?     Ovarian  ? Seizures Mother   ? Cancer Father   ?     Kidney  ? Cancer Paternal Aunt   ? ? ?SOCIAL HISTORY: ? reports that she quit smoking about 14 years ago. Her smoking use included cigarettes. She has never used smokeless tobacco. She reports that she does not currently use alcohol. She reports current drug use. Drug: Marijuana. ? ?PERFORMANCE STATUS: ?The patient's performance status is 1 - Symptomatic but completely ambulatory ? ?PHYSICAL EXAM: ?Most  Recent Vital Signs: Blood pressure 116/71, pulse 78, temperature 98.4 ?F (36.9 ?C), temperature source Oral, resp. rate 18, height '5\' 5"'$  (1.651 m), weight 130 lb 6.4 oz (59.1 kg), SpO2 100 %. ?BP 116/71 (BP Location: Left Arm, Patient Position: Sitting)   Pulse 78   Temp 98.4 ?F (36.9 ?C) (Oral)   Resp 18   Ht '5\' 5"'$  (1.651 m)   Wt 130 lb 6.4 oz (59.1 kg)   SpO2 100%   BMI 21.70 kg/m?  ? ?General Appearance:    Alert, cooperative, no distress, appears stated age  ?Head:    Normocephalic, without obvious abnormality, atraumatic  ?Eyes:    PERRL, conjunctiva/corneas clear, EOM's intact,  fundi  ?  benign, both eyes  ?   ?   ?Throat:   Lips, mucosa, and tongue normal; teeth and gums normal  ?Neck:   Supple, symmetrical, trachea midline, no adenopathy;  ?  thyroid:  no enlargement/tenderness/nodules; no carotid ?  bruit or JVD  ?Back:     Symmetric, no curvature, ROM normal, no CVA tenderness  ?Lungs:     Clear to auscultation bilaterally, respirations unlabored  ?Chest Wall:    No tenderness or deformity  ? Heart:    Regular rate and rhythm, S1 and S2 normal, no murmur, rub   or gallop  ?   ?Abdomen:     Soft, non-tender, bowel sounds active all four quadrants,  ?  no masses, no organomegaly  ?   ?   ?Extremities:   Extremities normal, atraumatic, no cyanosis or edema  ?Pulses:   2+ and symmetric all extremities  ?Skin:   Skin color, texture, turgor normal, no rashes or lesions  ?Lymph nodes:   Cervical, supraclavicular, and axillary nodes normal  ?Neurologic:   CNII-XII intact, normal strength, sensation and reflexes  ?  throughout  ? ? ?LABORATORY DATA:  ?Results for orders placed or performed in visit on 12/12/21 (from the past 48 hour(s))  ?CBC with Differential (Cancer Center Only)     Status: Abnormal (Preliminary result)  ? Collection Time: 12/12/21  8:56 AM  ?Result Value Ref Range  ? WBC Count 3.8 (L) 4.0 - 10.5 K/uL  ? RBC 3.36 (L) 3.87 - 5.11 MIL/uL  ? Hemoglobin 6.3 (LL) 12.0 - 15.0 g/dL  ?  Comment: REPEATED TO VERIFY ?Reticulocyte Hemoglobin testing ?may be clinically indicated, ?consider ordering this additional ?test [id number] ?THIS CRITICAL RESULT HAS VERIFIED AND BEEN CALLED TO STACY C. RN BY SHERRY ROY ON 05 08 2023 AT 0915, AND HAS BEEN READ BACK.  ?THIS CRITICAL RESULT HAS VERIFIED AND BEEN CALLED TO STACY C. RN BY SHERRY ROY ON 05 08 2023 AT 0916, AND HAS BEEN READ BACK.  ?  ? HCT 22.0 (L) 36.0 - 46.0 %  ? MCV 65.5 (L) 80.0 - 100.0 fL  ? MCH 18.8 (L) 26.0 - 34.0 pg  ? MCHC 28.6 (L) 30.0 - 36.0 g/dL  ? RDW 20.3 (H) 11.5 - 15.5 %  ? Platelet Count 360 150 - 400 K/uL  ? nRBC 0.0 0.0  - 0.2 %  ?  Comment: Performed at [facility] at [facility], [address], [place], [place] [address]  ? Neutrophils Relative % PENDING %  ? Neutro Abs PENDING 1.7 - 7.7 K/uL  ? Band Neutrophils PENDING %  ? Lymphocytes Relative PENDING %  ? Lymphs Abs PENDING 0.7 - 4.0 K/uL  ? Monocytes Relative PENDING %  ? Monocytes Absolute  PENDING 0.1 - 1.0 K/uL  ? Eosinophils Relative PENDING %  ? Eosinophils Absolute PENDING 0.0 - 0.5 K/uL  ? Basophils Relative PENDING %  ? Basophils Absolute PENDING 0.0 - 0.1 K/uL  ? WBC Morphology PENDING   ? RBC Morphology PENDING   ? Smear Review PENDING   ? Other PENDING %  ? nRBC PENDING 0 /100 WBC  ? Metamyelocytes Relative PENDING %  ? Myelocytes PENDING %  ? Promyelocytes Relative PENDING %  ? Blasts PENDING %  ? Immature Granulocytes PENDING %  ? Abs Immature Granulocytes PENDING 0.00 - 0.07 K/uL  ?   ? ?RADIOGRAPHY: ?No results found.   ?  ?PATHOLOGY: None ? ?ASSESSMENT/PLAN: Alexis Nelson is a very pleasant 36 yo African American female with history of anemia since her first pregnancy as well as the sickle cell trait.  ?She is now having a heavy irregular cycle and is waiting for her new patient with gynecology to discuss treatment options.  ?Hgb today is 6.3 improved from previous Hgb of 5.  ?Iron saturation is only 2%.  ?IV iron given today and we will give her a second dose next week.  ?We will also have her start taking folic acid 1 mg PO daily.  ?Follow-up in 6 weeks.  ? ?All questions were answered. The patient knows to call the clinic with any problems, questions or concerns. We can certainly see the patient much sooner if necessary. ? ? ?Lottie Dawson, NP ?  ? ? ? ?  ?

## 2021-12-12 NOTE — Patient Instructions (Signed)
Sodium Ferric Gluconate Complex Injection What is this medication? SODIUM FERRIC GLUCONATE COMPLEX (SOE dee um FER ik GLOO koe nate KOM pleks) treats low levels of iron (iron deficiency anemia) in people with kidney disease. Iron is a mineral that plays an important role in making red blood cells, which carry oxygen from your lungs to the rest of your body. This medicine may be used for other purposes; ask your health care provider or pharmacist if you have questions. COMMON BRAND NAME(S): Ferrlecit, Nulecit What should I tell my care team before I take this medication? They need to know if you have any of the following conditions: Anemia that is not from iron deficiency High levels of iron in the blood An unusual or allergic reaction to iron, other medications, foods, dyes, or preservatives Pregnant or are trying to become pregnant Breast-feeding How should I use this medication? This medication is injected into a vein. It is given by your care team in a hospital or clinic setting. Talk to your care team about the use of this medication in children. While it may be prescribed for children as young as 6 years for selected conditions, precautions do apply. Overdosage: If you think you have taken too much of this medicine contact a poison control center or emergency room at once. NOTE: This medicine is only for you. Do not share this medicine with others. What if I miss a dose? It is important not to miss your dose. Call your care team if you are unable to keep an appointment. What may interact with this medication? Do not take this medication with any of the following: Deferasirox Deferoxamine Dimercaprol This medication may also interact with the following: Other iron products This list may not describe all possible interactions. Give your health care provider a list of all the medicines, herbs, non-prescription drugs, or dietary supplements you use. Also tell them if you smoke, drink  alcohol, or use illegal drugs. Some items may interact with your medicine. What should I watch for while using this medication? Your condition will be monitored carefully while you are receiving this medication. Visit your care team for regular checks on your progress. You may need blood work while you are taking this medication. What side effects may I notice from receiving this medication? Side effects that you should report to your care team as soon as possible: Allergic reactions--skin rash, itching, hives, swelling of the face, lips, tongue, or throat Low blood pressure--dizziness, feeling faint or lightheaded, blurry vision Shortness of breath Side effects that usually do not require medical attention (report to your care team if they continue or are bothersome): Flushing Headache Joint pain Muscle pain Nausea Pain, redness, or irritation at injection site This list may not describe all possible side effects. Call your doctor for medical advice about side effects. You may report side effects to FDA at 1-800-FDA-1088. Where should I keep my medication? This medication is given in a hospital or clinic and will not be stored at home. NOTE: This sheet is a summary. It may not cover all possible information. If you have questions about this medicine, talk to your doctor, pharmacist, or health care provider.  2023 Elsevier/Gold Standard (2020-12-17 00:00:00)  

## 2021-12-12 NOTE — Telephone Encounter (Signed)
Lab brought to my attention a panic HGB of 6.3. Results given to MD.  ?

## 2021-12-14 LAB — ERYTHROPOIETIN: Erythropoietin: 219.3 m[IU]/mL — ABNORMAL HIGH (ref 2.6–18.5)

## 2021-12-16 ENCOUNTER — Ambulatory Visit: Payer: Self-pay | Admitting: Family Medicine

## 2021-12-19 ENCOUNTER — Other Ambulatory Visit: Payer: Self-pay | Admitting: Family

## 2021-12-19 ENCOUNTER — Inpatient Hospital Stay: Payer: BC Managed Care – PPO

## 2021-12-19 VITALS — BP 113/54 | HR 75 | Temp 98.1°F | Resp 17

## 2021-12-19 DIAGNOSIS — D573 Sickle-cell trait: Secondary | ICD-10-CM | POA: Diagnosis not present

## 2021-12-19 DIAGNOSIS — D509 Iron deficiency anemia, unspecified: Secondary | ICD-10-CM | POA: Diagnosis not present

## 2021-12-19 DIAGNOSIS — D500 Iron deficiency anemia secondary to blood loss (chronic): Secondary | ICD-10-CM

## 2021-12-19 DIAGNOSIS — N921 Excessive and frequent menstruation with irregular cycle: Secondary | ICD-10-CM | POA: Diagnosis not present

## 2021-12-19 LAB — PATHOLOGIST SMEAR REVIEW
Basophils Absolute: 0.1 10*3/uL (ref 0.0–0.2)
Basos: 2 %
EOS (ABSOLUTE): 0.2 10*3/uL (ref 0.0–0.4)
Eos: 5 %
Hematocrit: 22.1 % — ABNORMAL LOW (ref 34.0–46.6)
Hemoglobin: 6.1 g/dL — CL (ref 11.1–15.9)
Immature Grans (Abs): 0 10*3/uL (ref 0.0–0.1)
Immature Granulocytes: 0 %
Lymphocytes Absolute: 1.8 10*3/uL (ref 0.7–3.1)
Lymphs: 39 %
MCH: 18.9 pg — ABNORMAL LOW (ref 26.6–33.0)
MCHC: 27.6 g/dL — ABNORMAL LOW (ref 31.5–35.7)
MCV: 68 fL — ABNORMAL LOW (ref 79–97)
Monocytes Absolute: 0.4 10*3/uL (ref 0.1–0.9)
Monocytes: 8 %
Neutrophils Absolute: 2.1 10*3/uL (ref 1.4–7.0)
Neutrophils: 46 %
Platelets: 334 10*3/uL (ref 150–450)
RBC: 3.23 x10E6/uL — ABNORMAL LOW (ref 3.77–5.28)
RDW: 19.2 % — ABNORMAL HIGH (ref 11.7–15.4)
WBC: 4.5 10*3/uL (ref 3.4–10.8)

## 2021-12-19 MED ORDER — SODIUM CHLORIDE 0.9 % IV SOLN: Freq: Once | INTRAVENOUS | Status: AC

## 2021-12-19 MED ORDER — SODIUM CHLORIDE 0.9 % IV SOLN
125.0000 mg | Freq: Once | INTRAVENOUS | Status: AC
  Administered 2021-12-19: 125 mg via INTRAVENOUS
  Filled 2021-12-19: qty 125

## 2021-12-19 MED ORDER — METHYLPREDNISOLONE SODIUM SUCC 40 MG IJ SOLR
40.0000 mg | Freq: Once | INTRAMUSCULAR | Status: AC
  Administered 2021-12-19: 40 mg via INTRAVENOUS
  Filled 2021-12-19: qty 1

## 2021-12-19 MED ORDER — SODIUM CHLORIDE 0.9% FLUSH: 10.0000 mL | Freq: Once | INTRAVENOUS | Status: DC | PRN

## 2021-12-19 MED ORDER — SODIUM CHLORIDE 0.9% FLUSH: 3.0000 mL | Freq: Once | INTRAVENOUS | Status: DC | PRN

## 2021-12-19 MED ORDER — SODIUM CHLORIDE 0.9 % IV SOLN
40.0000 mg | Freq: Once | INTRAVENOUS | Status: AC
  Administered 2021-12-19: 40 mg via INTRAVENOUS
  Filled 2021-12-19: qty 4

## 2021-12-19 NOTE — Progress Notes (Signed)
Per pt last time she had iron her arm was swollen from her wrist to her shoulder x 1 day. Pt declined pain, redness or other s/s. ?Per Dr Marin Olp give her Solumedrol 60 mg and Pepcid 40 mg prior to dose today. Orders placed and pt agreed. ?

## 2021-12-19 NOTE — Patient Instructions (Signed)
Sodium Ferric Gluconate Complex Injection What is this medication? SODIUM FERRIC GLUCONATE COMPLEX (SOE dee um FER ik GLOO koe nate KOM pleks) treats low levels of iron (iron deficiency anemia) in people with kidney disease. Iron is a mineral that plays an important role in making red blood cells, which carry oxygen from your lungs to the rest of your body. This medicine may be used for other purposes; ask your health care provider or pharmacist if you have questions. COMMON BRAND NAME(S): Ferrlecit, Nulecit What should I tell my care team before I take this medication? They need to know if you have any of the following conditions: Anemia that is not from iron deficiency High levels of iron in the blood An unusual or allergic reaction to iron, other medications, foods, dyes, or preservatives Pregnant or are trying to become pregnant Breast-feeding How should I use this medication? This medication is injected into a vein. It is given by your care team in a hospital or clinic setting. Talk to your care team about the use of this medication in children. While it may be prescribed for children as young as 6 years for selected conditions, precautions do apply. Overdosage: If you think you have taken too much of this medicine contact a poison control center or emergency room at once. NOTE: This medicine is only for you. Do not share this medicine with others. What if I miss a dose? It is important not to miss your dose. Call your care team if you are unable to keep an appointment. What may interact with this medication? Do not take this medication with any of the following: Deferasirox Deferoxamine Dimercaprol This medication may also interact with the following: Other iron products This list may not describe all possible interactions. Give your health care provider a list of all the medicines, herbs, non-prescription drugs, or dietary supplements you use. Also tell them if you smoke, drink  alcohol, or use illegal drugs. Some items may interact with your medicine. What should I watch for while using this medication? Your condition will be monitored carefully while you are receiving this medication. Visit your care team for regular checks on your progress. You may need blood work while you are taking this medication. What side effects may I notice from receiving this medication? Side effects that you should report to your care team as soon as possible: Allergic reactions--skin rash, itching, hives, swelling of the face, lips, tongue, or throat Low blood pressure--dizziness, feeling faint or lightheaded, blurry vision Shortness of breath Side effects that usually do not require medical attention (report to your care team if they continue or are bothersome): Flushing Headache Joint pain Muscle pain Nausea Pain, redness, or irritation at injection site This list may not describe all possible side effects. Call your doctor for medical advice about side effects. You may report side effects to FDA at 1-800-FDA-1088. Where should I keep my medication? This medication is given in a hospital or clinic and will not be stored at home. NOTE: This sheet is a summary. It may not cover all possible information. If you have questions about this medicine, talk to your doctor, pharmacist, or health care provider.  2023 Elsevier/Gold Standard (2020-12-17 00:00:00)  

## 2021-12-19 NOTE — Progress Notes (Signed)
Hypersensitivity Reaction note ? ?Date of event: 12/12/21 ?Time of event: unsure ?Generic name of drug involved: ferric gluconate ?Name of provider notified of the hypersensitivity reaction: Scherrie Bateman, NP, Burney Gauze, MD ?Was agent that likely caused hypersensitivity reaction added to Allergies List within EMR? yes ?Chain of events including reaction signs/symptoms, treatment administered, and outcome (e.g., drug resumed; drug discontinued; sent to Emergency Department; etc.) See progress note from 12/19/2021 ? ?

## 2021-12-22 LAB — ALPHA-THALASSEMIA GENOTYPR

## 2022-01-23 ENCOUNTER — Inpatient Hospital Stay: Payer: BC Managed Care – PPO | Attending: Hematology & Oncology

## 2022-01-23 ENCOUNTER — Other Ambulatory Visit: Payer: Self-pay | Admitting: Oncology

## 2022-01-23 ENCOUNTER — Inpatient Hospital Stay (HOSPITAL_BASED_OUTPATIENT_CLINIC_OR_DEPARTMENT_OTHER): Payer: BC Managed Care – PPO | Admitting: Family

## 2022-01-23 ENCOUNTER — Inpatient Hospital Stay: Payer: BC Managed Care – PPO

## 2022-01-23 VITALS — BP 119/68 | HR 62 | Temp 98.2°F | Resp 18 | Wt 126.1 lb

## 2022-01-23 DIAGNOSIS — D573 Sickle-cell trait: Secondary | ICD-10-CM | POA: Diagnosis not present

## 2022-01-23 DIAGNOSIS — D500 Iron deficiency anemia secondary to blood loss (chronic): Secondary | ICD-10-CM

## 2022-01-23 DIAGNOSIS — D563 Thalassemia minor: Secondary | ICD-10-CM | POA: Diagnosis not present

## 2022-01-23 DIAGNOSIS — D649 Anemia, unspecified: Secondary | ICD-10-CM

## 2022-01-23 DIAGNOSIS — D509 Iron deficiency anemia, unspecified: Secondary | ICD-10-CM | POA: Insufficient documentation

## 2022-01-23 LAB — CBC WITH DIFFERENTIAL (CANCER CENTER ONLY)
Abs Immature Granulocytes: 0.04 10*3/uL (ref 0.00–0.07)
Basophils Absolute: 0.1 10*3/uL (ref 0.0–0.1)
Basophils Relative: 1 %
Eosinophils Absolute: 0.3 10*3/uL (ref 0.0–0.5)
Eosinophils Relative: 5 %
HCT: 33.1 % — ABNORMAL LOW (ref 36.0–46.0)
Hemoglobin: 9.6 g/dL — ABNORMAL LOW (ref 12.0–15.0)
Immature Granulocytes: 1 %
Lymphocytes Relative: 49 %
Lymphs Abs: 2.8 10*3/uL (ref 0.7–4.0)
MCH: 21.8 pg — ABNORMAL LOW (ref 26.0–34.0)
MCHC: 29 g/dL — ABNORMAL LOW (ref 30.0–36.0)
MCV: 75.1 fL — ABNORMAL LOW (ref 80.0–100.0)
Monocytes Absolute: 0.3 10*3/uL (ref 0.1–1.0)
Monocytes Relative: 6 %
Neutro Abs: 2.2 10*3/uL (ref 1.7–7.7)
Neutrophils Relative %: 38 %
Platelet Count: 235 10*3/uL (ref 150–400)
RBC: 4.41 MIL/uL (ref 3.87–5.11)
RDW: 27.4 % — ABNORMAL HIGH (ref 11.5–15.5)
WBC Count: 5.6 10*3/uL (ref 4.0–10.5)
nRBC: 0 % (ref 0.0–0.2)

## 2022-01-23 LAB — RETICULOCYTES
Immature Retic Fract: 9.2 % (ref 2.3–15.9)
RBC.: 4.38 MIL/uL (ref 3.87–5.11)
Retic Count, Absolute: 19.7 10*3/uL (ref 19.0–186.0)
Retic Ct Pct: 0.5 % (ref 0.4–3.1)

## 2022-01-23 LAB — IRON AND IRON BINDING CAPACITY (CC-WL,HP ONLY)
Iron: 95 ug/dL (ref 28–170)
Saturation Ratios: 24 % (ref 10.4–31.8)
TIBC: 395 ug/dL (ref 250–450)
UIBC: 300 ug/dL (ref 148–442)

## 2022-01-23 LAB — FERRITIN: Ferritin: 11 ng/mL (ref 11–307)

## 2022-01-23 NOTE — Progress Notes (Signed)
Hematology and Oncology Follow Up Visit  Alexis Nelson [id number] 01-29-1986 36 y.o. 01/23/2022   Principle Diagnosis:  Iron deficiency anemia  Sickle cell trait Alpha thalassemia minor trait  Current Therapy:   IV iron as indicated Folic acid 1 mg PO daily   Interim History:  Alexis Nelson is here today for follow-up. She is feeling much better since receiving IV iron.  Her cycle remains heavy and she has been on for the last 2 weeks.  She has an appointment with gynecology at the end of the month to discuss treatment options.  No other blood loss noted. No bruising or petechiae.  No fever, chills, n/v, cough, rash, dizziness, SOB, chest pain, palpitations, abdominal pain or changes in bowel or bladder habits.  No swelling, tenderness, numbness or tingling in her extremities.  No falls or syncope to report.  Appetite and hydration have been good. Her weight is stable at 126 lbs.   ECOG Performance Status: 1 - Symptomatic but completely ambulatory  Medications:  Allergies as of 01/23/2022       Reactions   Sodium Ferric Gluconate [ferrous Gluconate] Swelling   Patient reported on 12/19/2021 that on 12/12/21 when she had a ferric gluconate infusion her left arm (the infusion arm) got swollen after she got home. Did not have any other symptoms. Her arm looked normal today. Advised her to call the office if she'll experience any side effects again. Premedications were added to her treatment plan and she tolerated the infusion without events.        Medication List        Accurate as of January 23, 2022  9:38 AM. If you have any questions, ask your nurse or doctor.          folic acid 1 MG tablet Commonly known as: FOLVITE Take 1 tablet (1 mg total) by mouth daily.   Iron (Ferrous Sulfate) 325 (65 Fe) MG Tabs Take 325 mg by mouth every other day.   polyethylene glycol 17 g packet Commonly known as: MIRALAX / GLYCOLAX Take 17 g by mouth daily. Until stooling  regularly, adjust dose every 3 days to allow at least 1 smooth BM daily.   senna-docusate 8.6-50 MG tablet Commonly known as: Senokot-S Take 2 tablets by mouth 2 (two) times daily as needed for mild constipation or moderate constipation. Until stooling regularly        Allergies:  Allergies  Allergen Reactions   Sodium Ferric Gluconate [Ferrous Gluconate] Swelling    Patient reported on 12/19/2021 that on 12/12/21 when she had a ferric gluconate infusion her left arm (the infusion arm) got swollen after she got home. Did not have any other symptoms. Her arm looked normal today. Advised her to call the office if she'll experience any side effects again. Premedications were added to her treatment plan and she tolerated the infusion without events.    Past Medical History, Surgical history, Social history, and Family History were reviewed and updated.  Review of Systems: All other 10 point review of systems is negative.   Physical Exam:  weight is 126 lb 1.9 oz (57.2 kg). Her oral temperature is 98.2 F (36.8 C). Her blood pressure is 119/68 and her pulse is 62. Her respiration is 18 and oxygen saturation is 100%.   Wt Readings from Last 3 Encounters:  01/23/22 126 lb 1.9 oz (57.2 kg)  12/12/21 130 lb 6.4 oz (59.1 kg)  12/07/21 129 lb (58.5 kg)    Ocular: Sclerae unicteric,  pupils equal, round and reactive to light Ear-nose-throat: Oropharynx clear, dentition fair Lymphatic: No cervical or supraclavicular adenopathy Lungs no rales or rhonchi, good excursion bilaterally Heart regular rate and rhythm, no murmur appreciated Abd soft, nontender, positive bowel sounds MSK no focal spinal tenderness, no joint edema Neuro: non-focal, well-oriented, appropriate affect Breasts: Deferred   Lab Results  Component Value Date   WBC 5.6 01/23/2022   HGB 9.6 (L) 01/23/2022   HCT 33.1 (L) 01/23/2022   MCV 75.1 (L) 01/23/2022   PLT 235 01/23/2022   Lab Results  Component Value Date    FERRITIN 2 (L) 12/12/2021   IRON 11 (L) 12/12/2021   TIBC 501 (H) 12/12/2021   UIBC 490 (H) 12/12/2021   IRONPCTSAT 2 (L) 12/12/2021   Lab Results  Component Value Date   RETICCTPCT 0.5 01/23/2022   RBC 4.41 01/23/2022   RBC 4.38 01/23/2022   No results found for: "KPAFRELGTCHN", "LAMBDASER", "KAPLAMBRATIO" No results found for: "IGGSERUM", "IGA", "IGMSERUM" No results found for: "TOTALPROTELP", "ALBUMINELP", "A1GS", "A2GS", "BETS", "BETA2SER", "GAMS", "MSPIKE", "SPEI"   Chemistry      Component Value Date/Time   NA 141 12/12/2021 0856   NA 141 11/04/2021 0000   NA 141 11/01/2014 2046   K 3.6 12/12/2021 0856   K 3.3 (L) 11/01/2014 2046   CL 107 12/12/2021 0856   CL 103 11/01/2014 2046   CO2 28 12/12/2021 0856   CO2 28 11/01/2014 2046   BUN 7 12/12/2021 0856   BUN 8 11/04/2021 0000   BUN 10 11/01/2014 2046   CREATININE 0.78 12/12/2021 0856   CREATININE 0.78 11/01/2014 2046   GLU 91 11/04/2021 0000      Component Value Date/Time   CALCIUM 9.5 12/12/2021 0856   CALCIUM 9.9 11/01/2014 2046   ALKPHOS 30 (L) 12/12/2021 0856   ALKPHOS 53 11/01/2014 2046   AST 13 (L) 12/12/2021 0856   ALT 8 12/12/2021 0856   ALT 15 11/01/2014 2046   BILITOT 0.5 12/12/2021 0856       Impression and Plan: Alexis Nelson is a very pleasant 36 yo African American female with history of iron deficiency anemia since her first pregnancy as well as the sickle cell trait and alpha thalassemia minor trait.  Continue 1 mg Folic acid PO daily.  Iron studies pending.  Follow-up in 3 months.    Lottie Dawson, NP 6/19/20239:38 AM

## 2022-01-31 DIAGNOSIS — Z01419 Encounter for gynecological examination (general) (routine) without abnormal findings: Secondary | ICD-10-CM | POA: Diagnosis not present

## 2022-01-31 DIAGNOSIS — N921 Excessive and frequent menstruation with irregular cycle: Secondary | ICD-10-CM | POA: Diagnosis not present

## 2022-01-31 DIAGNOSIS — D500 Iron deficiency anemia secondary to blood loss (chronic): Secondary | ICD-10-CM | POA: Diagnosis not present

## 2022-01-31 DIAGNOSIS — Z113 Encounter for screening for infections with a predominantly sexual mode of transmission: Secondary | ICD-10-CM | POA: Diagnosis not present

## 2022-01-31 DIAGNOSIS — Z1331 Encounter for screening for depression: Secondary | ICD-10-CM | POA: Diagnosis not present

## 2022-02-01 ENCOUNTER — Telehealth: Payer: Self-pay | Admitting: *Deleted

## 2022-02-01 NOTE — Telephone Encounter (Signed)
Called and lvm of upcoming appointments - requested callback to confirm 

## 2022-02-06 ENCOUNTER — Encounter: Payer: Self-pay | Admitting: Family Medicine

## 2022-02-06 ENCOUNTER — Ambulatory Visit (INDEPENDENT_AMBULATORY_CARE_PROVIDER_SITE_OTHER): Payer: BC Managed Care – PPO | Admitting: Family Medicine

## 2022-02-06 VITALS — BP 108/72 | HR 78 | Ht 65.0 in | Wt 125.0 lb

## 2022-02-06 DIAGNOSIS — J309 Allergic rhinitis, unspecified: Secondary | ICD-10-CM | POA: Insufficient documentation

## 2022-02-06 DIAGNOSIS — Z0000 Encounter for general adult medical examination without abnormal findings: Secondary | ICD-10-CM | POA: Insufficient documentation

## 2022-02-06 DIAGNOSIS — D500 Iron deficiency anemia secondary to blood loss (chronic): Secondary | ICD-10-CM | POA: Diagnosis not present

## 2022-02-06 DIAGNOSIS — D573 Sickle-cell trait: Secondary | ICD-10-CM

## 2022-02-06 DIAGNOSIS — K5909 Other constipation: Secondary | ICD-10-CM | POA: Diagnosis not present

## 2022-02-06 DIAGNOSIS — R5382 Chronic fatigue, unspecified: Secondary | ICD-10-CM

## 2022-02-06 NOTE — Progress Notes (Signed)
Annual Physical Exam Visit  Patient Information:  Patient ID: Alexis Nelson, female DOB: 02-08-1986 Age: 36 y.o. MRN: [id number]   Subjective:   CC: Annual Physical Exam  HPI:  Alexis Nelson is here for their annual physical.  I reviewed the past medical history, family history, social history, surgical history, and allergies today and changes were made as necessary.  Please see the problem list section below for additional details.  Past Medical History: Past Medical History:  Diagnosis Date   Sickle cell trait [facility])    Past Surgical History: Past Surgical History:  Procedure Laterality Date   TUBAL LIGATION  11/2010   Family History: Family History  Problem Relation Age of Onset   Cancer Mother        Ovarian   Seizures Mother    Cancer Father        Kidney   Cancer Paternal Aunt    Allergies: Allergies  Allergen Reactions   Sodium Ferric Gluconate [Ferrous Gluconate] Swelling    Patient reported on 12/19/2021 that on 12/12/21 when she had a ferric gluconate infusion her left arm (the infusion arm) got swollen after she got home. Did not have any other symptoms. Her arm looked normal today. Advised her to call the office if she'll experience any side effects again. Premedications were added to her treatment plan and she tolerated the infusion without events.   Health Maintenance: Health Maintenance  Topic Date Due   TETANUS/TDAP  12/08/2022 (Originally 10/15/2004)   INFLUENZA VACCINE  03/07/2022   PAP SMEAR-Modifier  04/26/2026   Hepatitis C Screening  Completed   HIV Screening  Completed   HPV VACCINES  Aged Out   COVID-19 Vaccine  Discontinued    HM Colonoscopy     This patient has no relevant Health Maintenance data.      Medications: Current Outpatient Medications on File Prior to Visit  Medication Sig Dispense Refill   folic acid (FOLVITE) 1 MG tablet Take 1 tablet (1 mg total) by mouth daily. 30 tablet 11   Iron, Ferrous  Sulfate, 325 (65 Fe) MG TABS Take 325 mg by mouth every other day. 15 tablet 2   polyethylene glycol (MIRALAX / GLYCOLAX) 17 g packet Take 17 g by mouth daily. Until stooling regularly, adjust dose every 3 days to allow at least 1 smooth BM daily. 30 packet 11   senna-docusate (SENOKOT-S) 8.6-50 MG tablet Take 2 tablets by mouth 2 (two) times daily as needed for mild constipation or moderate constipation. Until stooling regularly 60 tablet 0   No current facility-administered medications on file prior to visit.    Review of Systems: No headache, visual changes, nausea, vomiting, diarrhea, constipation, dizziness, abdominal pain, skin rash, fevers, chills, night sweats, swollen lymph nodes, weight loss, chest pain, body aches, joint swelling, muscle aches, shortness of breath, mood changes, visual or auditory hallucinations reported.  Objective:   Vitals:   02/06/22 0840  BP: 108/72  Pulse: 78  SpO2: 99%   Vitals:   02/06/22 0840  Weight: 125 lb (56.7 kg)  Height: '5\' 5"'$  (1.651 m)   Body mass index is 20.8 kg/m.  General: Well Developed, well nourished, and in no acute distress.  Neuro: Alert and oriented x3, extra-ocular muscles intact, sensation grossly intact. Cranial nerves II through XII are grossly intact, motor, sensory, and coordinative functions are intact. HEENT: Normocephalic, atraumatic, pupils equal round reactive to light, neck supple, no masses, no lymphadenopathy, thyroid nonpalpable. Oropharynx, nasopharynx with  swollen turbinates, external ear canals are unremarkable. Skin: Warm and dry, no rashes noted.  Cardiac: Regular rate and rhythm, no murmurs rubs or gallops. No peripheral edema. Pulses symmetric. Respiratory: Clear to auscultation bilaterally. Not using accessory muscles, speaking in full sentences.  Abdominal: Soft, nontender, nondistended, positive normoactive bowel sounds, no masses, no organomegaly. Musculoskeletal: Shoulder, elbow, wrist, hip, knee, ankle  stable, and with full range of motion.  Female chaperone initials: [place] present throughout the physical examination.  Impression and Recommendations:   The patient was counselled, risk factors were discussed, and anticipatory guidance given.  Problem List Items Addressed This Visit       Respiratory   Chronic allergic rhinitis    Chronic issue in the setting of lack of restful sleep, she has previously been seen by an outside allergy group, is interested in establishing with local group for tighter management.  Referral placed today in that regard.      Relevant Orders   Ambulatory referral to Allergy     Digestive   Constipation, chronic    Improvement following as needed senna-docusate and MiraLAX dosing.        Other   Iron deficiency anemia    Has been followed and managed by hematology, input and management greatly appreciated.  Repeat labs have demonstrated interval improvement, decreased fatigue reported.  We will continue to follow peripherally on this issue.      Sickle cell trait ([place])    She has recently been able to establish with hematology, will follow peripherally.      Chronic fatigue    Patient has noted improvement following iron replenishment and improved labs.  Despite this she continues to note fatigue, does describe lack of restful sleeping, snoring, in the setting of chronic allergic symptoms.  I did discuss either proceeding with home sleep study versus try to control her allergy symptoms.  She has opted to touch base with allergist, we will follow-up on this issue.      Annual physical exam - Primary    Annual examination completed, risk stratification labs previously ordered, anticipatory guidance provided.  We will follow labs once resulted.        Orders & Medications Medications: No orders of the defined types were placed in this encounter.  Orders Placed This Encounter  Procedures   Ambulatory referral to Allergy     Return in about 1  year (around 02/07/2023) for Annual Physical.    Montel Culver, MD   [place]

## 2022-02-06 NOTE — Assessment & Plan Note (Signed)
>>  ASSESSMENT AND PLAN FOR CHRONIC FATIGUE WRITTEN ON 02/06/2022 11:46 AM BY Megen Madewell J, MD  Patient has noted improvement following iron  replenishment and improved labs.  Despite this she continues to note fatigue, does describe lack of restful sleeping, snoring, in the setting of chronic allergic symptoms.  I did discuss either proceeding with home sleep study versus try to control her allergy symptoms.  She has opted to touch base with allergist, we will follow-up on this issue.

## 2022-02-06 NOTE — Assessment & Plan Note (Signed)
Patient has noted improvement following iron replenishment and improved labs.  Despite this she continues to note fatigue, does describe lack of restful sleeping, snoring, in the setting of chronic allergic symptoms.  I did discuss either proceeding with home sleep study versus try to control her allergy symptoms.  She has opted to touch base with allergist, we will follow-up on this issue.

## 2022-02-06 NOTE — Assessment & Plan Note (Signed)
Annual examination completed, risk stratification labs previously ordered, anticipatory guidance provided.  We will follow labs once resulted.

## 2022-02-06 NOTE — Assessment & Plan Note (Signed)
Has been followed and managed by hematology, input and management greatly appreciated.  Repeat labs have demonstrated interval improvement, decreased fatigue reported.  We will continue to follow peripherally on this issue.

## 2022-02-06 NOTE — Assessment & Plan Note (Signed)
Improvement following as needed senna-docusate and MiraLAX dosing.

## 2022-02-06 NOTE — Assessment & Plan Note (Signed)
She has recently been able to establish with hematology, will follow peripherally.

## 2022-02-06 NOTE — Patient Instructions (Addendum)
-   Review information provided - Attend eye doctor annually, dentist every 6 months, work towards or maintain 30 minutes of moderate intensity physical activity at least 5 days per week, and consume a balanced diet - Return in 1 year for physical - Contact us for any questions between now and then  

## 2022-02-06 NOTE — Assessment & Plan Note (Signed)
Chronic issue in the setting of lack of restful sleep, she has previously been seen by an outside allergy group, is interested in establishing with local group for tighter management.  Referral placed today in that regard.

## 2022-02-20 DIAGNOSIS — N840 Polyp of corpus uteri: Secondary | ICD-10-CM | POA: Diagnosis not present

## 2022-02-20 DIAGNOSIS — N921 Excessive and frequent menstruation with irregular cycle: Secondary | ICD-10-CM | POA: Diagnosis not present

## 2022-03-14 DIAGNOSIS — D500 Iron deficiency anemia secondary to blood loss (chronic): Secondary | ICD-10-CM | POA: Diagnosis not present

## 2022-03-14 DIAGNOSIS — Z113 Encounter for screening for infections with a predominantly sexual mode of transmission: Secondary | ICD-10-CM | POA: Diagnosis not present

## 2022-03-14 DIAGNOSIS — N921 Excessive and frequent menstruation with irregular cycle: Secondary | ICD-10-CM | POA: Diagnosis not present

## 2022-03-14 DIAGNOSIS — Z3202 Encounter for pregnancy test, result negative: Secondary | ICD-10-CM | POA: Diagnosis not present

## 2022-03-14 DIAGNOSIS — N840 Polyp of corpus uteri: Secondary | ICD-10-CM | POA: Diagnosis not present
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
## 2022-03-15 ENCOUNTER — Encounter
Admission: RE | Disposition: A | Discharge status: Home / Self Care | Payer: Self-pay | Source: Home / Self Care | Attending: Obstetrics and Gynecology

## 2022-03-15 ENCOUNTER — Encounter: Payer: Self-pay | Admitting: Obstetrics and Gynecology

## 2022-03-15 ENCOUNTER — Other Ambulatory Visit: Payer: Self-pay

## 2022-03-15 ENCOUNTER — Ambulatory Visit: Payer: BC Managed Care – PPO | Admitting: Certified Registered Nurse Anesthetist

## 2022-03-15 ENCOUNTER — Ambulatory Visit
Admission: RE | Admit: 2022-03-15 | Discharge: 2022-03-15 | Disposition: A | Discharge status: Home / Self Care | Payer: BC Managed Care – PPO | Attending: Obstetrics and Gynecology | Admitting: Obstetrics and Gynecology

## 2022-03-15 DIAGNOSIS — N921 Excessive and frequent menstruation with irregular cycle: Secondary | ICD-10-CM | POA: Diagnosis present

## 2022-03-15 DIAGNOSIS — N840 Polyp of corpus uteri: Secondary | ICD-10-CM | POA: Diagnosis not present

## 2022-03-15 DIAGNOSIS — Z419 Encounter for procedure for purposes other than remedying health state, unspecified: Secondary | ICD-10-CM

## 2022-03-15 DIAGNOSIS — Z30430 Encounter for insertion of intrauterine contraceptive device: Secondary | ICD-10-CM | POA: Diagnosis not present

## 2022-03-15 DIAGNOSIS — D259 Leiomyoma of uterus, unspecified: Secondary | ICD-10-CM | POA: Diagnosis not present

## 2022-03-15 DIAGNOSIS — D500 Iron deficiency anemia secondary to blood loss (chronic): Secondary | ICD-10-CM | POA: Diagnosis not present

## 2022-03-15 DIAGNOSIS — D573 Sickle-cell trait: Secondary | ICD-10-CM | POA: Diagnosis not present

## 2022-03-15 DIAGNOSIS — N920 Excessive and frequent menstruation with regular cycle: Secondary | ICD-10-CM | POA: Diagnosis not present

## 2022-03-15 DIAGNOSIS — Z87891 Personal history of nicotine dependence: Secondary | ICD-10-CM | POA: Insufficient documentation

## 2022-03-15 HISTORY — PX: HYSTEROSCOPY WITH D & C: SHX1775

## 2022-03-15 HISTORY — PX: INTRAUTERINE DEVICE (IUD) INSERTION: SHX5877

## 2022-03-15 LAB — TYPE AND SCREEN
ABO/RH(D): B POS
Antibody Screen: NEGATIVE

## 2022-03-15 LAB — POCT PREGNANCY, URINE: Preg Test, Ur: NEGATIVE

## 2022-03-15 SURGERY — DILATATION AND CURETTAGE /HYSTEROSCOPY
Anesthesia: General | Site: Uterus

## 2022-03-15 MED ORDER — MIDAZOLAM HCL 2 MG/2ML IJ SOLN
INTRAMUSCULAR | Status: DC | PRN
  Administered 2022-03-15: 2 mg via INTRAVENOUS

## 2022-03-15 MED ORDER — PHENYLEPHRINE 80 MCG/ML (10ML) SYRINGE FOR IV PUSH (FOR BLOOD PRESSURE SUPPORT)
PREFILLED_SYRINGE | INTRAVENOUS | Status: DC | PRN
  Administered 2022-03-15 (×2): 80 ug via INTRAVENOUS

## 2022-03-15 MED ORDER — LACTATED RINGERS IV SOLN: INTRAVENOUS | Status: DC

## 2022-03-15 MED ORDER — ACETAMINOPHEN 10 MG/ML IV SOLN
INTRAVENOUS | Status: AC
  Filled 2022-03-15: qty 100

## 2022-03-15 MED ORDER — KETOROLAC TROMETHAMINE 30 MG/ML IJ SOLN
INTRAMUSCULAR | Status: DC | PRN
  Administered 2022-03-15: 30 mg via INTRAVENOUS

## 2022-03-15 MED ORDER — ONDANSETRON HCL 4 MG/2ML IJ SOLN: 4.0000 mg | Freq: Once | INTRAMUSCULAR | Status: DC | PRN

## 2022-03-15 MED ORDER — CHLORHEXIDINE GLUCONATE 0.12 % MT SOLN: 15.0000 mL | Freq: Once | OROMUCOSAL | Status: AC

## 2022-03-15 MED ORDER — ORAL CARE MOUTH RINSE: 15.0000 mL | Freq: Once | OROMUCOSAL | Status: AC

## 2022-03-15 MED ORDER — OXYCODONE HCL 5 MG PO TABS
5.0000 mg | ORAL_TABLET | Freq: Once | ORAL | Status: AC | PRN
  Administered 2022-03-15: 5 mg via ORAL

## 2022-03-15 MED ORDER — SILVER NITRATE-POT NITRATE 75-25 % EX MISC
CUTANEOUS | Status: AC
  Filled 2022-03-15: qty 10

## 2022-03-15 MED ORDER — PROPOFOL 10 MG/ML IV BOLUS
INTRAVENOUS | Status: AC
  Filled 2022-03-15: qty 20

## 2022-03-15 MED ORDER — ACETAMINOPHEN 10 MG/ML IV SOLN
INTRAVENOUS | Status: DC | PRN
  Administered 2022-03-15: 1000 mg via INTRAVENOUS

## 2022-03-15 MED ORDER — GLYCOPYRROLATE 0.2 MG/ML IJ SOLN
INTRAMUSCULAR | Status: DC | PRN
  Administered 2022-03-15: .2 mg via INTRAVENOUS

## 2022-03-15 MED ORDER — OXYCODONE HCL 5 MG/5ML PO SOLN: 5.0000 mg | Freq: Once | ORAL | Status: AC | PRN

## 2022-03-15 MED ORDER — CHLORHEXIDINE GLUCONATE 0.12 % MT SOLN
OROMUCOSAL | Status: AC
  Administered 2022-03-15: 15 mL via OROMUCOSAL
  Filled 2022-03-15: qty 15

## 2022-03-15 MED ORDER — PROPOFOL 10 MG/ML IV BOLUS
INTRAVENOUS | Status: DC | PRN
  Administered 2022-03-15: 150 mg via INTRAVENOUS

## 2022-03-15 MED ORDER — ONDANSETRON HCL 4 MG/2ML IJ SOLN
INTRAMUSCULAR | Status: DC | PRN
  Administered 2022-03-15: 4 mg via INTRAVENOUS

## 2022-03-15 MED ORDER — SODIUM CHLORIDE 0.9 % IR SOLN
Status: DC | PRN
  Administered 2022-03-15: 3000 mL

## 2022-03-15 MED ORDER — LIDOCAINE HCL (CARDIAC) PF 100 MG/5ML IV SOSY
PREFILLED_SYRINGE | INTRAVENOUS | Status: DC | PRN
  Administered 2022-03-15: 60 mg via INTRAVENOUS

## 2022-03-15 MED ORDER — PHENYLEPHRINE 80 MCG/ML (10ML) SYRINGE FOR IV PUSH (FOR BLOOD PRESSURE SUPPORT)
PREFILLED_SYRINGE | INTRAVENOUS | Status: AC
  Filled 2022-03-15: qty 10

## 2022-03-15 MED ORDER — MIDAZOLAM HCL 2 MG/2ML IJ SOLN
INTRAMUSCULAR | Status: AC
  Filled 2022-03-15: qty 2

## 2022-03-15 MED ORDER — POVIDONE-IODINE 10 % EX SWAB: 2.0000 | Freq: Once | CUTANEOUS | Status: DC

## 2022-03-15 MED ORDER — EPHEDRINE 5 MG/ML INJ
INTRAVENOUS | Status: AC
  Filled 2022-03-15: qty 5

## 2022-03-15 MED ORDER — FENTANYL CITRATE (PF) 100 MCG/2ML IJ SOLN
INTRAMUSCULAR | Status: AC
  Filled 2022-03-15: qty 2

## 2022-03-15 MED ORDER — GLYCOPYRROLATE 0.2 MG/ML IJ SOLN
INTRAMUSCULAR | Status: AC
  Filled 2022-03-15: qty 1

## 2022-03-15 MED ORDER — IBUPROFEN 600 MG PO TABS: 600.0000 mg | ORAL_TABLET | Freq: Four times a day (QID) | ORAL | 0 refills | Status: AC | PRN

## 2022-03-15 MED ORDER — SILVER NITRATE-POT NITRATE 75-25 % EX MISC
CUTANEOUS | Status: DC | PRN
  Administered 2022-03-15: 2 via TOPICAL

## 2022-03-15 MED ORDER — ACETAMINOPHEN 10 MG/ML IV SOLN: 1000.0000 mg | Freq: Once | INTRAVENOUS | Status: DC | PRN

## 2022-03-15 MED ORDER — 0.9 % SODIUM CHLORIDE (POUR BTL) OPTIME
TOPICAL | Status: DC | PRN
  Administered 2022-03-15: 500 mL

## 2022-03-15 MED ORDER — DEXAMETHASONE SODIUM PHOSPHATE 10 MG/ML IJ SOLN
INTRAMUSCULAR | Status: DC | PRN
  Administered 2022-03-15: 10 mg via INTRAVENOUS

## 2022-03-15 MED ORDER — LEVONORGESTREL 20 MCG/DAY IU IUD
1.0000 | INTRAUTERINE_SYSTEM | INTRAUTERINE | Status: AC
  Administered 2022-03-15: 1 via INTRAUTERINE

## 2022-03-15 MED ORDER — OXYCODONE HCL 5 MG PO TABS
ORAL_TABLET | ORAL | Status: AC
  Filled 2022-03-15: qty 1

## 2022-03-15 MED ORDER — FENTANYL CITRATE (PF) 100 MCG/2ML IJ SOLN
INTRAMUSCULAR | Status: DC | PRN
Start: 2022-03-15 — End: 2022-03-15
  Administered 2022-03-15 (×4): 25 ug via INTRAVENOUS

## 2022-03-15 MED ORDER — FENTANYL CITRATE (PF) 100 MCG/2ML IJ SOLN: 25.0000 ug | INTRAMUSCULAR | Status: DC | PRN

## 2022-03-15 SURGICAL SUPPLY — 34 items
BAG DRN RND TRDRP ANRFLXCHMBR (UROLOGICAL SUPPLIES)
BAG URINE DRAIN 2000ML AR STRL (UROLOGICAL SUPPLIES) IMPLANT
CATH FOLEY 2WAY  5CC 16FR (CATHETERS)
CATH FOLEY 2WAY 5CC 16FR (CATHETERS)
CATH URTH 16FR FL 2W BLN LF (CATHETERS) IMPLANT
DEVICE MYOSURE LITE (MISCELLANEOUS) ×2 IMPLANT
DEVICE MYOSURE REACH (MISCELLANEOUS) ×2 IMPLANT
DRSG TELFA 3X8 NADH (GAUZE/BANDAGES/DRESSINGS) IMPLANT
ELECT REM PT RETURN 9FT ADLT (ELECTROSURGICAL) ×2
ELECTRODE REM PT RTRN 9FT ADLT (ELECTROSURGICAL) ×1 IMPLANT
GLOVE BIO SURGEON STRL SZ7 (GLOVE) ×2 IMPLANT
GLOVE BIO SURGEON STRL SZ7.5 (GLOVE) ×2 IMPLANT
GLOVE BIOGEL PI IND STRL 7.5 (GLOVE) ×1 IMPLANT
GLOVE BIOGEL PI INDICATOR 7.5 (GLOVE) ×1
GOWN STRL REUS W/ TWL LRG LVL3 (GOWN DISPOSABLE) ×2 IMPLANT
GOWN STRL REUS W/TWL LRG LVL3 (GOWN DISPOSABLE) ×4
IV NS IRRIG 3000ML ARTHROMATIC (IV SOLUTION) ×2 IMPLANT
KIT PROCEDURE FLUENT (KITS) ×2 IMPLANT
KIT TURNOVER CYSTO (KITS) ×2 IMPLANT
MANIFOLD NEPTUNE II (INSTRUMENTS) ×2 IMPLANT
MIRENA INTRAUTERINE DEVICE ×1 IMPLANT
PACK DNC HYST (MISCELLANEOUS) ×2 IMPLANT
PAD DRESSING TELFA 3X8 NADH (GAUZE/BANDAGES/DRESSINGS) IMPLANT
PAD OB MATERNITY 4.3X12.25 (PERSONAL CARE ITEMS) ×2 IMPLANT
PAD PREP 24X41 OB/GYN DISP (PERSONAL CARE ITEMS) ×2 IMPLANT
SCRUB CHG 4% DYNA-HEX 4OZ (MISCELLANEOUS) ×2 IMPLANT
SEAL ROD LENS SCOPE MYOSURE (ABLATOR) ×2 IMPLANT
SET CYSTO W/LG BORE CLAMP LF (SET/KITS/TRAYS/PACK) IMPLANT
SURGILUBE 2OZ TUBE FLIPTOP (MISCELLANEOUS) ×2 IMPLANT
SYR 10ML LL (SYRINGE) IMPLANT
TOWEL OR 17X26 4PK STRL BLUE (TOWEL DISPOSABLE) ×2 IMPLANT
TRAP FLUID SMOKE EVACUATOR (MISCELLANEOUS) ×2 IMPLANT
TUBING CONNECTING 10 (TUBING) ×2 IMPLANT
WATER STERILE IRR 500ML POUR (IV SOLUTION) ×2 IMPLANT

## 2022-03-15 NOTE — Anesthesia Procedure Notes (Signed)
Procedure Name: LMA Insertion Date/Time: 03/15/2022 11:30 AM  Performed by: Lily Peer, Darianny Momon, CRNAPre-anesthesia Checklist: Patient identified, Emergency Drugs available, Suction available and Patient being monitored Patient Re-evaluated:Patient Re-evaluated prior to induction Oxygen Delivery Method: Circle system utilized Preoxygenation: Pre-oxygenation with 100% oxygen Induction Type: IV induction Ventilation: Mask ventilation without difficulty LMA: LMA inserted LMA Size: 3.0 Number of attempts: 1 Placement Confirmation: positive ETCO2 and breath sounds checked- equal and bilateral Tube secured with: Tape Dental Injury: Teeth and Oropharynx as per pre-operative assessment

## 2022-03-15 NOTE — Op Note (Signed)
Operative Note   Name: Alexis Nelson  Date of Service: 03/15/2022  DOB: [birth year]  MRN: [id number]   PRE-OP DIAGNOSIS:  1) Menorrhagia with irregular cycle 2) Endometrial polyp 3) anemia due to chronic blood loss   POST-OP DIAGNOSIS:  1) Menorrhagia with irregular cycle 2) Endometrial polyp 3) anemia due to chronic blood loss   SURGEON: Surgeon(s) and Role:    Will Bonnet, MD - Primary  PROCEDURE: Procedure(s): 1) Hysteroscopy, dilation and curettage, endometrial polypectomy 2) Intrauterine device insertion (Mirena device)  ANESTHESIA: General   ESTIMATED BLOOD LOSS: minimal  DRAINS: none   TOTAL IV FLUIDS: 700 mL  SPECIMENS:  Endometrial curettings and likely endometrial polyp  VTE PROPHYLAXIS: SCDs to the bilateral lower extremities  ANTIBIOTICS: none indicated, none given  FLUID DEFICIT: minimal  COMPLICATIONS: none  DISPOSITION: PACU - hemodynamically stable.  CONDITION: stable  INDICATION: 36 y.o. G28P0 female who has heavy, irregular menstrual bleeding. She has had such heavy bleeding that she has had severe anemia with her hemoglobin values in the 5 range.  She had an endometrial biopsy in the office that showed an endometrial polyp. She has elected removal of the polyp and placement of an intrauterine device as treatment.   FINDINGS: Exam under anesthesia revealed small, mobile anteflexed uterus with no masses and bilateral adnexa without masses or fullness. Hysteroscopy revealed a grossly normal appearing uterine cavity apart from a likely anterior polypoid lesion with bilateral tubal ostia and normal appearing endocervical canal.  PROCEDURE IN DETAIL:  After informed consent was obtained, the patient was taken to the operating room where anesthesia was obtained without difficulty. The patient was positioned in the dorsal, supine lithotomy position in [place].  The patient's bladder was catheterized with an in and out foley catheter.   The patient was examined under anesthesia, with the above noted findings.  The bi-valved speculum was placed inside the patient's vagina, and the the anterior lip of the cervix was grasped with the tenaculum.  The cervix was progressively dilated to a 7 mm Hegar dilator.  The hysteroscope was introduced, with the above noted findings.  With the anterior lesion noted, the MyoSure Light device was utilized to remove any excess or polypoid tissue.   The hystersocope was removed and the uterine cavity was curetted until a gritty texture was noted, yielding moderate endometrial curettings.  Excellent hemostasis was noted.    Uterus sounded to 9 cm. IUD placed per manufacturer's recommendations.  Strings trimmed to 3 cm. Tenaculum was removed, good hemostasis noted after application of silver nitrate to the tenaculum entry sites.  All instruments were removed, with excellent hemostasis noted throughout.  She was then taken out of dorsal lithotomy.  The patient tolerated the procedure well.  Sponge, lap and needle counts were correct x2.  The patient was taken to recovery room in excellent condition.  Prentice Docker, MD, [facility] 03/15/2022 12:14 PM

## 2022-03-15 NOTE — Anesthesia Preprocedure Evaluation (Addendum)
Anesthesia Evaluation  Patient identified by MRN, date of birth, ID band Patient awake    Reviewed: Allergy & Precautions, NPO status , Patient's Chart, lab work & pertinent test results  History of Anesthesia Complications Negative for: history of anesthetic complications  Airway Mallampati: II   Neck ROM: Full    Dental  (+) Chipped   Pulmonary former smoker (quit 2009),    Pulmonary exam normal breath sounds clear to auscultation       Cardiovascular Exercise Tolerance: Good negative cardio ROS Normal cardiovascular exam Rhythm:Regular Rate:Normal     Neuro/Psych negative neurological ROS     GI/Hepatic negative GI ROS,   Endo/Other  negative endocrine ROS  Renal/GU negative Renal ROS     Musculoskeletal   Abdominal   Peds  Hematology  (+) Blood dyscrasia, Sickle cell trait and anemia ,   Anesthesia Other Findings   Reproductive/Obstetrics                            Anesthesia Physical Anesthesia Plan  ASA: 2  Anesthesia Plan: General   Post-op Pain Management:    Induction: Intravenous  PONV Risk Score and Plan: 3 and Ondansetron, Dexamethasone and Treatment may vary due to age or medical condition  Airway Management Planned: LMA  Additional Equipment:   Intra-op Plan:   Post-operative Plan: Extubation in OR  Informed Consent: I have reviewed the patients History and Physical, chart, labs and discussed the procedure including the risks, benefits and alternatives for the proposed anesthesia with the patient or authorized representative who has indicated his/her understanding and acceptance.     Dental advisory given  Plan Discussed with: CRNA  Anesthesia Plan Comments: (Patient consented for risks of anesthesia including but not limited to:  - adverse reactions to medications - damage to eyes, teeth, lips or other oral mucosa - nerve damage due to positioning  -  sore throat or hoarseness - damage to heart, brain, nerves, lungs, other parts of body or loss of life  Informed patient about role of CRNA in peri- and intra-operative care.  Patient voiced understanding.)        Anesthesia Quick Evaluation

## 2022-03-15 NOTE — Discharge Instructions (Signed)

## 2022-03-15 NOTE — Transfer of Care (Signed)
Immediate Anesthesia Transfer of Care Note  Patient: [place]  Procedure(s) Performed: DILATATION AND CURETTAGE /HYSTEROSCOPY, ENDOMETRIAL POLYPECTOMY (Uterus) INTRAUTERINE DEVICE (IUD) INSERTION - MIRENA (Uterus)  Patient Location: PACU  Anesthesia Type:General  Level of Consciousness: drowsy  Airway & Oxygen Therapy: Patient Spontanous Breathing and Patient connected to face mask oxygen  Post-op Assessment: Report given to RN and Post -op Vital signs reviewed and stable  Post vital signs: Reviewed and stable  Last Vitals:  Vitals Value Taken Time  BP 131/78   Temp    Pulse 98 03/15/22 1213  Resp 14 03/15/22 1213  SpO2 100 % 03/15/22 1213  Vitals shown include unvalidated device data.  Last Pain:  Vitals:   03/15/22 1030  TempSrc: Oral  PainSc: 0-No pain         Complications: No notable events documented.

## 2022-03-15 NOTE — Interval H&P Note (Signed)
History and Physical Interval Note:  03/15/2022 11:18 AM  Alexis Nelson  has presented today for surgery, with the diagnosis of Menorrhagia with irregular cycle, endometrial polyp, anemia due to chronic blood loss.  The various methods of treatment have been discussed with the patient and family. After consideration of risks, benefits and other options for treatment, the patient has consented to  Procedure(s): DILATATION AND CURETTAGE /HYSTEROSCOPY, ENDOMETRIAL POLYPECTOMY (N/A) INTRAUTERINE DEVICE (IUD) INSERTION - MIRENA (N/A) as a surgical intervention.  The patient's history has been reviewed, patient examined, no change in status, stable for surgery.  I have reviewed the patient's chart and labs.  Questions were answered to the patient's satisfaction.  Consents reviewed and signed. Patient wishes to proceed.   Prentice Docker, MD, [facility] 03/15/2022 11:19 AM

## 2022-03-15 NOTE — Anesthesia Postprocedure Evaluation (Signed)
Anesthesia Post Note  Patient: Alexis Nelson  Procedure(s) Performed: DILATATION AND CURETTAGE /HYSTEROSCOPY, ENDOMETRIAL POLYPECTOMY (Uterus) INTRAUTERINE DEVICE (IUD) INSERTION - MIRENA (Uterus)  Patient location during evaluation: PACU Anesthesia Type: General Level of consciousness: awake and alert, oriented and patient cooperative Pain management: pain level controlled Vital Signs Assessment: post-procedure vital signs reviewed and stable Respiratory status: spontaneous breathing, nonlabored ventilation and respiratory function stable Cardiovascular status: blood pressure returned to baseline and stable Postop Assessment: adequate PO intake Anesthetic complications: no   No notable events documented.   Last Vitals:  Vitals:   03/15/22 1301 03/15/22 1311  BP: 125/84 129/71  Pulse: 77 80  Resp: 12 18  Temp: (!) 36.4 C 36.8 C  SpO2: 100% 100%    Last Pain:  Vitals:   03/15/22 1311  TempSrc: Temporal  PainSc: 0-No pain                 Darrin Nipper

## 2022-03-16 ENCOUNTER — Encounter: Payer: Self-pay | Admitting: Obstetrics and Gynecology

## 2022-03-16 LAB — SURGICAL PATHOLOGY

## 2022-03-16 LAB — ABO/RH: ABO/RH(D): B POS

## 2022-04-26 ENCOUNTER — Ambulatory Visit: Payer: BC Managed Care – PPO | Admitting: Family

## 2022-04-26 ENCOUNTER — Other Ambulatory Visit: Payer: BC Managed Care – PPO

## 2022-05-09 ENCOUNTER — Inpatient Hospital Stay: Payer: BC Managed Care – PPO | Attending: Family | Admitting: Medical Oncology

## 2022-05-09 ENCOUNTER — Encounter: Payer: Self-pay | Admitting: Medical Oncology

## 2022-05-09 ENCOUNTER — Inpatient Hospital Stay: Payer: BC Managed Care – PPO

## 2022-05-09 VITALS — BP 108/66 | HR 83 | Temp 98.4°F | Resp 17 | Wt 125.0 lb

## 2022-05-09 DIAGNOSIS — D500 Iron deficiency anemia secondary to blood loss (chronic): Secondary | ICD-10-CM

## 2022-05-09 DIAGNOSIS — D563 Thalassemia minor: Secondary | ICD-10-CM | POA: Diagnosis not present

## 2022-05-09 DIAGNOSIS — D509 Iron deficiency anemia, unspecified: Secondary | ICD-10-CM | POA: Insufficient documentation

## 2022-05-09 DIAGNOSIS — D508 Other iron deficiency anemias: Secondary | ICD-10-CM | POA: Diagnosis not present

## 2022-05-09 DIAGNOSIS — D573 Sickle-cell trait: Secondary | ICD-10-CM | POA: Diagnosis not present

## 2022-05-09 LAB — CBC WITH DIFFERENTIAL (CANCER CENTER ONLY)
Abs Immature Granulocytes: 0.05 10*3/uL (ref 0.00–0.07)
Basophils Absolute: 0.1 10*3/uL (ref 0.0–0.1)
Basophils Relative: 1 %
Eosinophils Absolute: 0.5 10*3/uL (ref 0.0–0.5)
Eosinophils Relative: 8 %
HCT: 33.4 % — ABNORMAL LOW (ref 36.0–46.0)
Hemoglobin: 10.6 g/dL — ABNORMAL LOW (ref 12.0–15.0)
Immature Granulocytes: 1 %
Lymphocytes Relative: 32 %
Lymphs Abs: 1.8 10*3/uL (ref 0.7–4.0)
MCH: 26.2 pg (ref 26.0–34.0)
MCHC: 31.7 g/dL (ref 30.0–36.0)
MCV: 82.5 fL (ref 80.0–100.0)
Monocytes Absolute: 0.5 10*3/uL (ref 0.1–1.0)
Monocytes Relative: 8 %
Neutro Abs: 2.7 10*3/uL (ref 1.7–7.7)
Neutrophils Relative %: 50 %
Platelet Count: 256 10*3/uL (ref 150–400)
RBC: 4.05 MIL/uL (ref 3.87–5.11)
RDW: 14.2 % (ref 11.5–15.5)
WBC Count: 5.5 10*3/uL (ref 4.0–10.5)
nRBC: 0 % (ref 0.0–0.2)

## 2022-05-09 LAB — IRON AND IRON BINDING CAPACITY (CC-WL,HP ONLY)
Iron: 71 ug/dL (ref 28–170)
Saturation Ratios: 15 % (ref 10.4–31.8)
TIBC: 475 ug/dL — ABNORMAL HIGH (ref 250–450)
UIBC: 404 ug/dL (ref 148–442)

## 2022-05-09 LAB — RETICULOCYTES
Immature Retic Fract: 8.3 % (ref 2.3–15.9)
RBC.: 4.04 MIL/uL (ref 3.87–5.11)
Retic Count, Absolute: 33.5 K/uL (ref 19.0–186.0)
Retic Ct Pct: 0.8 % (ref 0.4–3.1)

## 2022-05-09 LAB — FERRITIN: Ferritin: 5 ng/mL — ABNORMAL LOW (ref 11–307)

## 2022-05-09 MED ORDER — DOCUSATE SODIUM 100 MG PO CAPS: 100.0000 mg | ORAL_CAPSULE | Freq: Two times a day (BID) | ORAL | 2 refills | Status: AC

## 2022-05-09 MED ORDER — FOLIC ACID 1 MG PO TABS: 1.0000 mg | ORAL_TABLET | Freq: Every day | ORAL | 3 refills | Status: DC

## 2022-05-09 MED ORDER — IRON (FERROUS SULFATE) 325 (65 FE) MG PO TABS: 325.0000 mg | ORAL_TABLET | ORAL | 1 refills | Status: DC

## 2022-05-09 NOTE — Progress Notes (Signed)
Hematology and Oncology Follow Up Visit  Alexis Nelson [id number] 15-Jul-1986 36 y.o. 05/09/2022   Principle Diagnosis:  Iron deficiency anemia  Sickle cell trait Alpha thalassemia minor trait  Current Therapy:   IV iron as indicated Folic acid 1 mg PO daily   Interim History:  Alexis Nelson is here today for follow-up.   Since her last visit she had a D&C as well as IUD placement.  She states that her menstrual cycles have significantly improved.  She has had 1 menstrual cycle since this time was significantly lighter in nature and shorter in duration.  She reports that she is very pleased with this.  Less fatigue.  No shortness of breath.  No other source of blood loss.  She does report that she is not currently taking her folic acid or iron tablets as she was not sure if she should continue this.    ECOG Performance Status: 1 - Symptomatic but completely ambulatory  Medications:  Allergies as of 05/09/2022       Reactions   Sodium Ferric Gluconate [ferrous Gluconate] Swelling   Patient reported on 12/19/2021 that on 12/12/21 when she had a ferric gluconate infusion her left arm (the infusion arm) got swollen after she got home. Did not have any other symptoms. Her arm looked normal today. Advised her to call the office if she'll experience any side effects again. Premedications were added to her treatment plan and she tolerated the infusion without events.        Medication List        Accurate as of May 09, 2022 11:06 AM. If you have any questions, ask your nurse or doctor.          docusate sodium 100 MG capsule Commonly known as: Colace Take 1 capsule (100 mg total) by mouth 2 (two) times daily. Started by: Hughie Closs, PA-C   folic acid 1 MG tablet Commonly known as: FOLVITE Take 1 tablet (1 mg total) by mouth daily.   ibuprofen 600 MG tablet Commonly known as: ADVIL Take 1 tablet (600 mg total) by mouth every 6 (six) hours as needed for  mild pain or moderate pain.   Iron (Ferrous Sulfate) 325 (65 Fe) MG Tabs Take 325 mg by mouth every other day.   polyethylene glycol 17 g packet Commonly known as: MIRALAX / GLYCOLAX Take 17 g by mouth daily. Until stooling regularly, adjust dose every 3 days to allow at least 1 smooth BM daily.   senna-docusate 8.6-50 MG tablet Commonly known as: Senokot-S Take 2 tablets by mouth 2 (two) times daily as needed for mild constipation or moderate constipation. Until stooling regularly        Allergies:  Allergies  Allergen Reactions   Sodium Ferric Gluconate [Ferrous Gluconate] Swelling    Patient reported on 12/19/2021 that on 12/12/21 when she had a ferric gluconate infusion her left arm (the infusion arm) got swollen after she got home. Did not have any other symptoms. Her arm looked normal today. Advised her to call the office if she'll experience any side effects again. Premedications were added to her treatment plan and she tolerated the infusion without events.    Past Medical History, Surgical history, Social history, and Family History were reviewed and updated.  Review of Systems: All other 10 point review of systems is negative.   Physical Exam:  weight is 125 lb (56.7 kg). Her oral temperature is 98.4 F (36.9 C). Her blood pressure is 108/66 and  her pulse is 83. Her respiration is 17 and oxygen saturation is 100%.   Wt Readings from Last 3 Encounters:  05/09/22 125 lb (56.7 kg)  03/15/22 130 lb (59 kg)  02/06/22 125 lb (56.7 kg)    Ocular: Sclerae and conjunctiva without pallor, pupils equal, round and reactive to light Ear-nose-throat: Oropharynx clear, dentition fair Lymphatic: No cervical or supraclavicular adenopathy Lungs no rales or rhonchi, good excursion bilaterally Heart regular rate and rhythm, no murmur appreciated Abd soft, nontender, positive bowel sounds MSK no focal spinal tenderness, no joint edema Neuro: non-focal, well-oriented, appropriate  affect  Lab Results  Component Value Date   WBC 5.5 05/09/2022   HGB 10.6 (L) 05/09/2022   HCT 33.4 (L) 05/09/2022   MCV 82.5 05/09/2022   PLT 256 05/09/2022   Lab Results  Component Value Date   FERRITIN 11 01/23/2022   IRON 95 01/23/2022   TIBC 395 01/23/2022   UIBC 300 01/23/2022   IRONPCTSAT 24 01/23/2022   Lab Results  Component Value Date   RETICCTPCT 0.8 05/09/2022   RBC 4.05 05/09/2022   RBC 4.04 05/09/2022   No results found for: "KPAFRELGTCHN", "LAMBDASER", "KAPLAMBRATIO" No results found for: "IGGSERUM", "IGA", "IGMSERUM" No results found for: "TOTALPROTELP", "ALBUMINELP", "A1GS", "A2GS", "BETS", "BETA2SER", "GAMS", "MSPIKE", "SPEI"   Chemistry      Component Value Date/Time   NA 141 12/12/2021 0856   NA 141 11/04/2021 0000   NA 141 11/01/2014 2046   K 3.6 12/12/2021 0856   K 3.3 (L) 11/01/2014 2046   CL 107 12/12/2021 0856   CL 103 11/01/2014 2046   CO2 28 12/12/2021 0856   CO2 28 11/01/2014 2046   BUN 7 12/12/2021 0856   BUN 8 11/04/2021 0000   BUN 10 11/01/2014 2046   CREATININE 0.78 12/12/2021 0856   CREATININE 0.78 11/01/2014 2046   GLU 91 11/04/2021 0000      Component Value Date/Time   CALCIUM 9.5 12/12/2021 0856   CALCIUM 9.9 11/01/2014 2046   ALKPHOS 30 (L) 12/12/2021 0856   ALKPHOS 53 11/01/2014 2046   AST 13 (L) 12/12/2021 0856   ALT 8 12/12/2021 0856   ALT 15 11/01/2014 2046   BILITOT 0.5 12/12/2021 0856      Encounter Diagnoses  Name Primary?   Sickle cell trait (HCC)    Iron deficiency anemia due to chronic blood loss Yes   Alpha thalassaemia minor     Impression and Plan: Ms. Alexis Nelson is a very pleasant 36 yo [place] American female with history of iron deficiency anemia since her first pregnancy as well as the sickle cell trait and alpha thalassemia minor trait.   Her lab values have improved in terms of her hemoglobin which has elevated from 9.6-10.6.  Clinically she is feeling improved as well.  We have elected to have  her restart her folic acid and iron supplementations and recheck lab values in about 3 months.  Holding off on IV iron at this time as her main source of bleeding and iron deficiency has started to improve.  We expect her ferritin levels to improve naturally on their own during this duration.  Reviewed red flags that would indicate that she needs to be seen sooner.   Disposition No IV iron needed today-of note it appears that she needs premedications prior to iron infusions given the swelling reaction that she had in her forearm at the site of infusion back in May 2023 She will return to taking her folic acid, stool  softener and iron supplement RTC 3 months  Hughie Closs, PA-C 10/3/202311:06 AM

## 2022-08-01 ENCOUNTER — Encounter: Payer: Self-pay | Admitting: Family

## 2022-08-08 ENCOUNTER — Inpatient Hospital Stay: Payer: BC Managed Care – PPO | Attending: Family

## 2022-08-08 ENCOUNTER — Inpatient Hospital Stay: Payer: BC Managed Care – PPO | Admitting: Medical Oncology

## 2023-02-06 ENCOUNTER — Encounter: Payer: Self-pay | Admitting: Family

## 2023-02-07 ENCOUNTER — Ambulatory Visit: Payer: BC Managed Care – PPO

## 2023-02-09 ENCOUNTER — Encounter: Payer: BC Managed Care – PPO | Admitting: Family Medicine

## 2023-02-16 ENCOUNTER — Encounter: Payer: Self-pay | Admitting: Family

## 2023-02-20 ENCOUNTER — Ambulatory Visit (LOCAL_COMMUNITY_HEALTH_CENTER): Payer: Self-pay

## 2023-02-20 ENCOUNTER — Other Ambulatory Visit: Payer: BC Managed Care – PPO

## 2023-02-20 DIAGNOSIS — Z111 Encounter for screening for respiratory tuberculosis: Secondary | ICD-10-CM

## 2023-02-23 ENCOUNTER — Ambulatory Visit (LOCAL_COMMUNITY_HEALTH_CENTER): Payer: Self-pay

## 2023-02-23 ENCOUNTER — Encounter: Payer: BC Managed Care – PPO | Admitting: Family Medicine

## 2023-02-23 DIAGNOSIS — Z111 Encounter for screening for respiratory tuberculosis: Secondary | ICD-10-CM

## 2023-02-23 LAB — TB SKIN TEST
Induration: 0 mm
TB Skin Test: NEGATIVE

## 2023-02-26 ENCOUNTER — Encounter: Payer: Self-pay | Admitting: Family Medicine

## 2023-03-27 ENCOUNTER — Encounter: Payer: Self-pay | Admitting: Family

## 2023-04-08 DIAGNOSIS — Z419 Encounter for procedure for purposes other than remedying health state, unspecified: Secondary | ICD-10-CM | POA: Diagnosis not present

## 2023-05-08 DIAGNOSIS — Z419 Encounter for procedure for purposes other than remedying health state, unspecified: Secondary | ICD-10-CM | POA: Diagnosis not present

## 2023-06-06 ENCOUNTER — Encounter: Payer: Self-pay | Admitting: Family

## 2023-06-08 DIAGNOSIS — Z419 Encounter for procedure for purposes other than remedying health state, unspecified: Secondary | ICD-10-CM | POA: Diagnosis not present

## 2023-06-13 ENCOUNTER — Encounter: Payer: Medicaid Other | Admitting: Family Medicine

## 2023-07-08 DIAGNOSIS — Z419 Encounter for procedure for purposes other than remedying health state, unspecified: Secondary | ICD-10-CM | POA: Diagnosis not present

## 2023-08-08 DIAGNOSIS — Z419 Encounter for procedure for purposes other than remedying health state, unspecified: Secondary | ICD-10-CM | POA: Diagnosis not present

## 2023-09-08 DIAGNOSIS — Z419 Encounter for procedure for purposes other than remedying health state, unspecified: Secondary | ICD-10-CM | POA: Diagnosis not present

## 2023-10-06 DIAGNOSIS — Z419 Encounter for procedure for purposes other than remedying health state, unspecified: Secondary | ICD-10-CM | POA: Diagnosis not present

## 2023-11-17 DIAGNOSIS — Z419 Encounter for procedure for purposes other than remedying health state, unspecified: Secondary | ICD-10-CM | POA: Diagnosis not present

## 2023-12-17 DIAGNOSIS — Z419 Encounter for procedure for purposes other than remedying health state, unspecified: Secondary | ICD-10-CM | POA: Diagnosis not present

## 2024-01-17 DIAGNOSIS — Z419 Encounter for procedure for purposes other than remedying health state, unspecified: Secondary | ICD-10-CM | POA: Diagnosis not present

## 2024-02-16 DIAGNOSIS — Z419 Encounter for procedure for purposes other than remedying health state, unspecified: Secondary | ICD-10-CM | POA: Diagnosis not present

## 2024-03-18 DIAGNOSIS — Z419 Encounter for procedure for purposes other than remedying health state, unspecified: Secondary | ICD-10-CM | POA: Diagnosis not present

## 2024-04-18 DIAGNOSIS — Z419 Encounter for procedure for purposes other than remedying health state, unspecified: Secondary | ICD-10-CM | POA: Diagnosis not present

## 2024-06-11 ENCOUNTER — Encounter: Payer: Self-pay | Admitting: Family

## 2024-06-12 ENCOUNTER — Other Ambulatory Visit (HOSPITAL_COMMUNITY)
Admission: RE | Admit: 2024-06-12 | Discharge: 2024-06-12 | Disposition: A | Source: Ambulatory Visit | Attending: Family Medicine | Admitting: Family Medicine

## 2024-06-12 ENCOUNTER — Encounter: Payer: Self-pay | Admitting: Family

## 2024-06-12 ENCOUNTER — Encounter: Payer: Self-pay | Admitting: Family Medicine

## 2024-06-12 ENCOUNTER — Ambulatory Visit (INDEPENDENT_AMBULATORY_CARE_PROVIDER_SITE_OTHER): Admitting: Family Medicine

## 2024-06-12 VITALS — BP 90/68 | HR 87 | Temp 98.0°F | Ht 65.0 in | Wt 144.0 lb

## 2024-06-12 DIAGNOSIS — Z113 Encounter for screening for infections with a predominantly sexual mode of transmission: Secondary | ICD-10-CM | POA: Diagnosis not present

## 2024-06-12 DIAGNOSIS — N898 Other specified noninflammatory disorders of vagina: Secondary | ICD-10-CM | POA: Insufficient documentation

## 2024-06-12 DIAGNOSIS — Z0000 Encounter for general adult medical examination without abnormal findings: Secondary | ICD-10-CM

## 2024-06-12 DIAGNOSIS — N921 Excessive and frequent menstruation with irregular cycle: Secondary | ICD-10-CM | POA: Diagnosis not present

## 2024-06-12 DIAGNOSIS — D573 Sickle-cell trait: Secondary | ICD-10-CM

## 2024-06-12 DIAGNOSIS — R5382 Chronic fatigue, unspecified: Secondary | ICD-10-CM | POA: Diagnosis not present

## 2024-06-12 DIAGNOSIS — D500 Iron deficiency anemia secondary to blood loss (chronic): Secondary | ICD-10-CM

## 2024-06-12 DIAGNOSIS — Z23 Encounter for immunization: Secondary | ICD-10-CM

## 2024-06-12 DIAGNOSIS — K5909 Other constipation: Secondary | ICD-10-CM

## 2024-06-12 MED ORDER — FLUCONAZOLE 150 MG PO TABS: 150.0000 mg | ORAL_TABLET | Freq: Once | ORAL | 0 refills | Status: AC

## 2024-06-12 NOTE — Patient Instructions (Signed)
 VISIT SUMMARY:  You came in today because of vaginal discharge and pelvic pressure. We discussed your symptoms, and I prescribed medication and ordered several tests to determine the cause.  YOUR PLAN:  VAGINAL DISCHARGE: You have a likely due to a yeast infection. -Take the prescribed Diflucan (fluconazole) single tablet. -We have ordered screening tests. -Monitor your symptoms and report any changes. -Follow up with urgent care if your symptoms worsen over weekend. -We will communicate the results and any further treatment plan based on the test outcomes.

## 2024-06-12 NOTE — Assessment & Plan Note (Signed)
 History of Present Illness Alexis Nelson is a 38 year old female who presents with vaginal discharge.  Vaginal discharge and pelvic symptoms - Vaginal discharge present since earlier this week - Discharge is milky in appearance and lacks odor - No associated pruritus, dysuria, or flank pain - Lower abdominal and pelvic pressure present - No changes in symptoms since onset - No recent changes in personal care products, such as soaps or detergents - No symptoms suggestive of sexually transmitted infection aside from discharge  Gynecologic and sexual history - No new sexual partners or changes in intercourse - IUD placed prior to August 2023 - Last OB-GYN visit in August 2023 for postoperative check following IUD placement  Assessment and Plan Vaginal discharge Discharge consistent with yeast infection. - Prescribed Diflucan (fluconazole) single tablet. - Ordered screening labs - Advised to monitor symptoms and report changes. - Instructed to follow up with urgent care if symptoms worsen over weekend. - Will communicate results and further treatment plan based on test outcomes.

## 2024-06-12 NOTE — Progress Notes (Signed)
 Primary Care / Sports Medicine Office Visit  Patient Information:  Patient ID: Alexis Nelson, female DOB: 09-08-1985 Age: 38 y.o. MRN: [id number]   Alexis Nelson is a pleasant 38 y.o. female presenting with the following:  Chief Complaint  Patient presents with   STI screening    Patient having discharge and it has her concerned. Discharge is milky color. No itching, or odor.     Vitals:   06/12/24 1051  BP: 90/68  Pulse: 87  Temp: 98 F (36.7 C)  SpO2: 99%   Vitals:   06/12/24 1051  Weight: 144 lb (65.3 kg)  Height: 5' 5 (1.651 m)   Body mass index is 23.96 kg/m.  No results found.   Discussed the use of AI scribe software for clinical note transcription with the patient, who gave verbal consent to proceed.   Independent interpretation of notes and tests performed by another provider:   None  Procedures performed:   None  Pertinent History, Exam, Impression, and Recommendations:   Problem List Items Addressed This Visit     Anemia due to chronic blood loss   Annual physical exam   Relevant Orders   CBC   Comprehensive metabolic panel with GFR   Hemoglobin A1c   Lipid panel   Chronic fatigue   Relevant Orders   CBC   Comprehensive metabolic panel with GFR   TSH   Iron , TIBC and Ferritin Panel   Constipation, chronic   Relevant Orders   TSH   Iron  deficiency anemia   Relevant Orders   CBC   Iron , TIBC and Ferritin Panel   Menorrhagia with irregular cycle   Relevant Orders   CBC   TSH   Iron , TIBC and Ferritin Panel   Sickle cell trait   Relevant Orders   CBC   Iron , TIBC and Ferritin Panel   Vaginal discharge - Primary   History of Present Illness Alexis Nelson is a 38 year old female who presents with vaginal discharge.  Vaginal discharge and pelvic symptoms - Vaginal discharge present since earlier this week - Discharge is milky in appearance and lacks odor - No associated pruritus, dysuria, or flank  pain - Lower abdominal and pelvic pressure present - No changes in symptoms since onset - No recent changes in personal care products, such as soaps or detergents - No symptoms suggestive of sexually transmitted infection aside from discharge  Gynecologic and sexual history - No new sexual partners or changes in intercourse - IUD placed prior to August 2023 - Last OB-GYN visit in August 2023 for postoperative check following IUD placement  Assessment and Plan Vaginal discharge Discharge consistent with yeast infection. - Prescribed Diflucan (fluconazole) single tablet. - Ordered screening labs - Advised to monitor symptoms and report changes. - Instructed to follow up with urgent care if symptoms worsen over weekend. - Will communicate results and further treatment plan based on test outcomes.      Relevant Orders   Cervicovaginal ancillary only   Ct Ng M genitalium NAA, Urine   Other Visit Diagnoses       Routine screening for STI (sexually transmitted infection)       Relevant Orders   HIV Antibody (routine testing w rflx)   Hepatitis C antibody   RPR     Encounter for immunization       Relevant Orders   Tdap vaccine greater than or equal to 7yo IM (Completed)  Orders & Medications Medications:  Meds ordered this encounter  Medications   fluconazole (DIFLUCAN) 150 MG tablet    Sig: Take 1 tablet (150 mg total) by mouth once for 1 dose. May repeat after 3 days if needed.    Dispense:  2 tablet    Refill:  0   Orders Placed This Encounter  Procedures   Tdap vaccine greater than or equal to 7yo IM   CBC   Comprehensive metabolic panel with GFR   Hemoglobin A1c   Lipid panel   HIV Antibody (routine testing w rflx)   Hepatitis C antibody   Ct Ng M genitalium NAA, Urine   RPR   TSH   Iron , TIBC and Ferritin Panel     No follow-ups on file.     Alexis JINNY Ku, MD, [facility]   Primary Care Sports Medicine Primary Care and Sports Medicine at  [facility]

## 2024-06-13 LAB — CERVICOVAGINAL ANCILLARY ONLY
Bacterial Vaginitis (gardnerella): POSITIVE — AB
Candida Glabrata: NEGATIVE
Candida Vaginitis: POSITIVE — AB
Chlamydia: NEGATIVE
Comment: NEGATIVE
Comment: NEGATIVE
Comment: NEGATIVE
Comment: NEGATIVE
Comment: NEGATIVE
Comment: NORMAL
Neisseria Gonorrhea: NEGATIVE
Trichomonas: POSITIVE — AB

## 2024-06-15 LAB — COMPREHENSIVE METABOLIC PANEL WITH GFR
ALT: 10 IU/L (ref 0–32)
AST: 17 IU/L (ref 0–40)
Albumin: 4.9 g/dL (ref 3.9–4.9)
Alkaline Phosphatase: 42 IU/L (ref 41–116)
BUN/Creatinine Ratio: 12 (ref 9–23)
BUN: 9 mg/dL (ref 6–20)
Bilirubin Total: 1.1 mg/dL (ref 0.0–1.2)
CO2: 25 mmol/L (ref 20–29)
Calcium: 9.4 mg/dL (ref 8.7–10.2)
Chloride: 101 mmol/L (ref 96–106)
Creatinine, Ser: 0.77 mg/dL (ref 0.57–1.00)
Globulin, Total: 2.2 g/dL (ref 1.5–4.5)
Glucose: 82 mg/dL (ref 70–99)
Potassium: 3.9 mmol/L (ref 3.5–5.2)
Sodium: 140 mmol/L (ref 134–144)
Total Protein: 7.1 g/dL (ref 6.0–8.5)
eGFR: 101 mL/min/1.73 (ref >59)

## 2024-06-15 LAB — IRON,TIBC AND FERRITIN PANEL
Ferritin: 105 ng/mL (ref 15–150)
Iron Saturation: 53 % (ref 15–55)
Iron: 157 ug/dL (ref 27–159)
Total Iron Binding Capacity: 297 ug/dL (ref 250–450)
UIBC: 140 ug/dL (ref 131–425)

## 2024-06-15 LAB — CBC
Hematocrit: 38.5 % (ref 34.0–46.6)
Hemoglobin: 12.3 g/dL (ref 11.1–15.9)
MCH: 27.5 pg (ref 26.6–33.0)
MCHC: 31.9 g/dL (ref 31.5–35.7)
MCV: 86 fL (ref 79–97)
Platelets: 259 x10E3/uL (ref 150–450)
RBC: 4.47 x10E6/uL (ref 3.77–5.28)
RDW: 12.7 % (ref 11.7–15.4)
WBC: 4.9 x10E3/uL (ref 3.4–10.8)

## 2024-06-15 LAB — LIPID PANEL
Chol/HDL Ratio: 3.2 ratio (ref 0.0–4.4)
Cholesterol, Total: 130 mg/dL (ref 100–199)
HDL: 41 mg/dL (ref >39)
LDL Chol Calc (NIH): 76 mg/dL (ref 0–99)
Triglycerides: 59 mg/dL (ref 0–149)
VLDL Cholesterol Cal: 13 mg/dL (ref 5–40)

## 2024-06-15 LAB — HEMOGLOBIN A1C
Est. average glucose Bld gHb Est-mCnc: 108 mg/dL
Hgb A1c MFr Bld: 5.4 % (ref 4.8–5.6)

## 2024-06-15 LAB — RPR: RPR Ser Ql: NONREACTIVE

## 2024-06-15 LAB — HIV ANTIBODY (ROUTINE TESTING W REFLEX): HIV Screen 4th Generation wRfx: NONREACTIVE

## 2024-06-15 LAB — CT NG M GENITALIUM NAA, URINE
Chlamydia trachomatis, NAA: NEGATIVE
Mycoplasma genitalium NAA: NEGATIVE
Neisseria gonorrhoeae, NAA: NEGATIVE

## 2024-06-15 LAB — TSH: TSH: 1.74 u[IU]/mL (ref 0.450–4.500)

## 2024-06-15 LAB — HEPATITIS C ANTIBODY: Hep C Virus Ab: NONREACTIVE

## 2024-06-16 ENCOUNTER — Ambulatory Visit: Payer: Self-pay | Admitting: Family Medicine

## 2024-06-16 ENCOUNTER — Encounter: Payer: Self-pay | Admitting: Family Medicine

## 2024-06-16 DIAGNOSIS — B3731 Acute candidiasis of vulva and vagina: Secondary | ICD-10-CM

## 2024-06-16 DIAGNOSIS — A599 Trichomoniasis, unspecified: Secondary | ICD-10-CM

## 2024-06-16 DIAGNOSIS — B9689 Other specified bacterial agents as the cause of diseases classified elsewhere: Secondary | ICD-10-CM

## 2024-06-16 MED ORDER — METRONIDAZOLE 500 MG PO TABS: 500.0000 mg | ORAL_TABLET | Freq: Two times a day (BID) | ORAL | 0 refills | Status: AC

## 2024-07-07 ENCOUNTER — Other Ambulatory Visit: Payer: Self-pay | Admitting: Medical Genetics

## 2024-07-09 ENCOUNTER — Inpatient Hospital Stay
Admission: RE | Admit: 2024-07-09 | Discharge: 2024-07-09 | Payer: Self-pay | Attending: Medical Genetics | Admitting: Medical Genetics

## 2024-07-14 ENCOUNTER — Encounter: Payer: Self-pay | Admitting: Family Medicine

## 2024-07-14 ENCOUNTER — Ambulatory Visit (INDEPENDENT_AMBULATORY_CARE_PROVIDER_SITE_OTHER): Admitting: Family Medicine

## 2024-07-14 VITALS — BP 90/60 | HR 67 | Ht 65.0 in | Wt 138.0 lb

## 2024-07-14 DIAGNOSIS — J309 Allergic rhinitis, unspecified: Secondary | ICD-10-CM | POA: Diagnosis not present

## 2024-07-14 DIAGNOSIS — K5909 Other constipation: Secondary | ICD-10-CM | POA: Diagnosis not present

## 2024-07-14 DIAGNOSIS — Z0000 Encounter for general adult medical examination without abnormal findings: Secondary | ICD-10-CM

## 2024-07-14 DIAGNOSIS — D500 Iron deficiency anemia secondary to blood loss (chronic): Secondary | ICD-10-CM

## 2024-07-14 DIAGNOSIS — N921 Excessive and frequent menstruation with irregular cycle: Secondary | ICD-10-CM | POA: Diagnosis not present

## 2024-07-14 MED ORDER — AZELASTINE HCL 0.1 % NA SOLN: 1.0000 | Freq: Two times a day (BID) | NASAL | 0 refills | Status: AC | PRN

## 2024-07-14 NOTE — Progress Notes (Signed)
 Annual Physical Exam Visit  Patient Information:  Patient ID: Alexis Nelson, female DOB: [birth year] Age: 38 y.o. MRN: [id number]   Subjective:   CC: Annual Physical Exam  HPI:  Alexis Nelson is here for their annual physical.  I reviewed the past medical history, family history, social history, surgical history, and allergies today and changes were made as necessary.  Please see the problem list section below for additional details.  Past Medical History: Past Medical History:  Diagnosis Date   Sickle cell trait    Past Surgical History: Past Surgical History:  Procedure Laterality Date   HYSTEROSCOPY WITH D & C N/A 03/15/2022   Procedure: DILATATION AND CURETTAGE /HYSTEROSCOPY, ENDOMETRIAL POLYPECTOMY;  Surgeon: Leonce Garnette BIRCH, MD;  Location: [facility];  Service: Gynecology;  Laterality: N/A;   INTRAUTERINE DEVICE (IUD) INSERTION N/A 03/15/2022   Procedure: INTRAUTERINE DEVICE (IUD) INSERTION - MIRENA ;  Surgeon: Leonce Garnette BIRCH, MD;  Location: [facility];  Service: Gynecology;  Laterality: N/A;   TUBAL LIGATION  11/2010   Family History: Family History  Problem Relation Age of Onset   Cancer Mother        Ovarian   Seizures Mother    Cancer Father        Kidney   Cancer Paternal Aunt    Allergies: Allergies  Allergen Reactions   Sodium Ferric Gluconate [Ferrous Gluconate] Swelling    Patient reported on 12/19/2021 that on 12/12/21 when she had a ferric gluconate infusion her left arm (the infusion arm) got swollen after she got home. Did not have any other symptoms. Her arm looked normal today. Advised her to call the office if she'll experience any side effects again. Premedications were added to her treatment plan and she tolerated the infusion without events.   Health Maintenance: Health Maintenance  Topic Date Due   Hepatitis B Vaccines 19-59 Average Risk (1 of 3 - 19+ 3-dose series) Never done   HPV VACCINES (1 - 3-dose SCDM series) Never  done   Influenza Vaccine  11/04/2024 (Originally 03/07/2024)   Cervical Cancer Screening (HPV/Pap Cotest)  04/26/2026   DTaP/Tdap/Td (3 - Td or Tdap) 06/12/2034   Hepatitis C Screening  Completed   HIV Screening  Completed   Pneumococcal Vaccine  Aged Out   Meningococcal B Vaccine  Aged Out   COVID-19 Vaccine  Discontinued    HM Colonoscopy   This patient has no relevant Health Maintenance data.    Medications: Current Outpatient Medications on File Prior to Visit  Medication Sig Dispense Refill   ibuprofen  (ADVIL ) 600 MG tablet Take 1 tablet (600 mg total) by mouth every 6 (six) hours as needed for mild pain or moderate pain. 30 tablet 0   levonorgestrel  (MIRENA ) 20 MCG/DAY IUD 1 each by Intrauterine route once.     No current facility-administered medications on file prior to visit.    Discussed the use of AI scribe software for clinical note transcription with the patient, who gave verbal consent to proceed.   Objective:   Vitals:   07/14/24 0857  BP: 90/60  Pulse: 67  SpO2: 99%   Vitals:   07/14/24 0857  Weight: 138 lb (62.6 kg)  Height: 5' 5 (1.651 m)   Body mass index is 22.96 kg/m.  General: Well Developed, well nourished, and in no acute distress.  Neuro: Alert and oriented x3, extra-ocular muscles intact, sensation grossly intact. Cranial nerves II through XII are grossly intact, motor, sensory, and coordinative functions  are intact. HEENT: Normocephalic, atraumatic, neck supple, no masses, no lymphadenopathy, thyroid  nonenlarged. Oropharynx, nasopharynx, external ear canals are unremarkable. Skin: Warm and dry, no rashes noted.  Cardiac: Regular rate and rhythm, no murmurs rubs or gallops. No peripheral edema. Pulses symmetric. Respiratory: Clear to auscultation bilaterally. Speaking in full sentences.  Abdominal: Soft, nontender, nondistended, positive bowel sounds, no masses, no organomegaly. Musculoskeletal: Stable, and with full range of  motion.  Impression and Recommendations:   History of Present Illness Alexis Nelson is a 38 year old female who presents for a follow-up visit after recent lab work and treatment for pelvic symptoms.  Pelvic symptoms and vaginal discharge - Experienced pelvic symptoms and vaginal discharge approximately one month ago. - Symptoms resolved after a single dose of Diflucan  prescribed for yeast infection. - Did not require a second dose of Diflucan  as symptoms did not persist or recur. - No current pelvic symptoms or discharge.  History of vaginal infections - History of trichomonas, bacterial vaginosis, and candidiasis. - Previously treated with metronidazole  and Diflucan . - Completed all prescribed medications except for one pill of Diflucan , as symptoms resolved with initial dose.  Intrauterine device (iud) use - Mirena  IUD inserted around August 2023. - No side effects from IUD. - Menstrual cycles now last two to three days and are not heavy.  Allergic rhinitis - Experiences allergic rhinitis, particularly with weather changes. - Manages symptoms with Zyrtec. - Prefers not to use nasal sprays such as Flonase due to discomfort. - No current issues beyond mild symptoms managed with Zyrtec.  Bowel habits - Bowel movements are regular and occur daily since dietary changes. - Previously experienced difficulty passing stools when consuming beef, with bowel movements taking one to two weeks to pass.  Laboratory evaluation - Recent metabolic panel, including kidney function, liver enzymes, and glucose, was within normal limits. - Previously out-of-range liver enzymes are now normal.  Physical activity and diet - Follows a pescatarian diet. 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  Assessment and Plan General adult medical examination Routine follow-up showed normal labs and improved iron   levels. No signs of diabetes or prediabetes. Negative screenings for hepatitis C and STIs. - Continue current diet and lifestyle modifications. - Encouraged regular physical activity, 30 minutes five days a week, including strength training twice a week. - Scheduled follow-up in one year.  Allergic rhinitis Zyrtec effective. Discussed Astelin  nasal spray for localized treatment to reduce systemic exposure. - Prescribed Astelin  nasal spray for localized treatment. - Continue Zyrtec if Astelin  is not tolerated.  Intrauterine device management IUD in place since August 2023 for menstrual regulation. No side effects reported. Effective for contraception for eight years and menstrual regulation for five years. - Will need IUD replacement in August 2028.  The patient was counselled, risk factors were discussed, and anticipatory guidance given.  Problem List Items Addressed This Visit     Anemia due to chronic blood loss   Chronic allergic rhinitis   Relevant Medications   azelastine  (ASTELIN ) 0.1 % nasal spray   Constipation, chronic   Healthcare maintenance - Primary   Menorrhagia with irregular cycle     Orders & Medications Medications:  Meds ordered this encounter  Medications   azelastine  (ASTELIN ) 0.1 % nasal spray    Sig: Place 1-2 sprays into both nostrils 2 (two) times daily as needed for rhinitis. Use in each nostril as directed    Dispense:  30 mL    Refill:  0   No orders of the defined types were placed in this encounter.    No follow-ups on file.    Selinda JINNY Ku, MD, [facility]   Primary Care Sports Medicine Primary Care and Sports Medicine at [facility]

## 2024-07-14 NOTE — Patient Instructions (Addendum)
-   Obtain fasting labs with orders provided (can have water or black coffee but otherwise no food or drink x 8 hours before labs) - Review information provided - Attend eye doctor annually, dentist every 6 months, work towards or maintain 30 minutes of moderate intensity physical activity at least 5 days per week, and consume a balanced diet - Return in 1 year for physical - Contact us  for any questions between now and then   VISIT SUMMARY:  You had a follow-up visit to review your recent lab work and discuss your pelvic symptoms, vaginal discharge, and other health concerns. Your lab results were normal, and your symptoms have resolved. We also discussed your IUD, allergic rhinitis, and bowel habits.  YOUR PLAN:  GENERAL ADULT MEDICAL EXAMINATION: Routine follow-up showed normal labs and improved iron  levels. No signs of diabetes or prediabetes. Negative screenings for hepatitis C and STIs. -Continue your current diet and lifestyle modifications. -Engage in regular physical activity, aiming for 30 minutes five days a week, including strength training twice a week. -Schedule a follow-up in one year.  ALLERGIC RHINITIS: You experience allergic rhinitis, particularly with weather changes, and manage symptoms with Zyrtec. -Prescribed Astelin  nasal spray for localized treatment to reduce systemic exposure. -Continue Zyrtec if Astelin  is not tolerated.  INTRAUTERINE DEVICE MANAGEMENT: Your Mirena  IUD has been in place since August 2023 for menstrual regulation and contraception. No side effects reported. -Your IUD is effective for contraception for eight years and menstrual regulation for five years. - Need IUD replacement for August 2028.

## 2024-07-19 LAB — GENECONNECT MOLECULAR SCREEN: Genetic Analysis Overall Interpretation: NEGATIVE

## 2025-07-15 ENCOUNTER — Encounter: Admitting: Family Medicine
